# Patient Record
Sex: Female | Born: 1965 | Race: White | Hispanic: No | Marital: Single | Smoking: Current every day smoker
Health system: Southern US, Community
[De-identification: ages and names within clinical notes are randomized; demographics above are authoritative.]

## PROBLEM LIST (undated history)

## (undated) DIAGNOSIS — F209 Schizophrenia, unspecified: Secondary | ICD-10-CM

## (undated) DIAGNOSIS — F411 Generalized anxiety disorder: Secondary | ICD-10-CM

## (undated) DIAGNOSIS — E669 Obesity, unspecified: Secondary | ICD-10-CM

## (undated) DIAGNOSIS — J449 Chronic obstructive pulmonary disease, unspecified: Secondary | ICD-10-CM

## (undated) DIAGNOSIS — Z87820 Personal history of traumatic brain injury: Secondary | ICD-10-CM

## (undated) DIAGNOSIS — I10 Essential (primary) hypertension: Secondary | ICD-10-CM

## (undated) DIAGNOSIS — I5030 Unspecified diastolic (congestive) heart failure: Secondary | ICD-10-CM

## (undated) DIAGNOSIS — F319 Bipolar disorder, unspecified: Secondary | ICD-10-CM

## (undated) DIAGNOSIS — N3281 Overactive bladder: Secondary | ICD-10-CM

## (undated) DIAGNOSIS — D649 Anemia, unspecified: Secondary | ICD-10-CM

## (undated) DIAGNOSIS — M1990 Unspecified osteoarthritis, unspecified site: Secondary | ICD-10-CM

## (undated) DIAGNOSIS — N3941 Urge incontinence: Secondary | ICD-10-CM

## (undated) DIAGNOSIS — M259 Joint disorder, unspecified: Secondary | ICD-10-CM

## (undated) DIAGNOSIS — I639 Cerebral infarction, unspecified: Secondary | ICD-10-CM

## (undated) DIAGNOSIS — Z9181 History of falling: Secondary | ICD-10-CM

## (undated) DIAGNOSIS — K5909 Other constipation: Secondary | ICD-10-CM

## (undated) DIAGNOSIS — R413 Other amnesia: Secondary | ICD-10-CM

## (undated) DIAGNOSIS — F79 Unspecified intellectual disabilities: Secondary | ICD-10-CM

## (undated) DIAGNOSIS — R748 Abnormal levels of other serum enzymes: Secondary | ICD-10-CM

## (undated) DIAGNOSIS — F419 Anxiety disorder, unspecified: Secondary | ICD-10-CM

## (undated) DIAGNOSIS — G839 Paralytic syndrome, unspecified: Secondary | ICD-10-CM

## (undated) HISTORY — DX: Unspecified osteoarthritis, unspecified site: M19.90

## (undated) HISTORY — DX: Essential (primary) hypertension: I10

## (undated) HISTORY — DX: Anxiety disorder, unspecified: F41.9

## (undated) HISTORY — DX: Cerebral infarction, unspecified: I63.9

## (undated) HISTORY — DX: History of falling: Z91.81

## (undated) HISTORY — PX: OTHER SURGICAL HISTORY: SHX169

## (undated) HISTORY — PX: BRAIN SURGERY: SHX531

## (undated) HISTORY — DX: Other constipation: K59.09

## (undated) HISTORY — DX: Abnormal levels of other serum enzymes: R74.8

## (undated) HISTORY — DX: Overactive bladder: N32.81

---

## 1985-11-06 DIAGNOSIS — W3400XA Accidental discharge from unspecified firearms or gun, initial encounter: Secondary | ICD-10-CM

## 1985-11-06 HISTORY — DX: Accidental discharge from unspecified firearms or gun, initial encounter: W34.00XA

## 2002-02-18 ENCOUNTER — Encounter: Payer: Self-pay | Admitting: Emergency Medicine

## 2002-02-18 ENCOUNTER — Emergency Department (HOSPITAL_COMMUNITY): Admission: EM | Admit: 2002-02-18 | Discharge: 2002-02-18 | Payer: Self-pay | Admitting: Emergency Medicine

## 2017-08-01 ENCOUNTER — Encounter: Payer: Self-pay | Admitting: Podiatry

## 2017-08-01 ENCOUNTER — Ambulatory Visit (INDEPENDENT_AMBULATORY_CARE_PROVIDER_SITE_OTHER): Payer: Medicare Other | Admitting: Podiatry

## 2017-08-01 VITALS — BP 117/72 | HR 64 | Resp 16

## 2017-08-01 DIAGNOSIS — L600 Ingrowing nail: Secondary | ICD-10-CM | POA: Diagnosis not present

## 2017-08-01 DIAGNOSIS — B351 Tinea unguium: Secondary | ICD-10-CM | POA: Diagnosis not present

## 2017-08-01 NOTE — Progress Notes (Signed)
Subjective:    Patient ID: Karen Craig, female   DOB: 51 y.o.   MRN: [id number]   HPI patient presents stating she's had difficulties with her nails with the right being worse than the left with nail thickness dystrophic changes and sometimes inability to wear shoe gear comfortably   Review of Systems  All other systems reviewed and are negative.       Objective:  Physical Exam  Constitutional: She appears well-developed and well-nourished.  Cardiovascular: Intact distal pulses.   Pulmonary/Chest: Effort normal.  Musculoskeletal: Normal range of motion.  Neurological: She is alert. Sensory deficit: neurovascular status was found to be intact with muscle strength adequate range of motion mildly diminished. Patient's found have severe thickness dystrophic changes with discomfort of the hallux nail right over left.  Skin: Skin is warm.  Nursing note and vitals reviewed. 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

## 2017-08-01 NOTE — Progress Notes (Signed)
   Subjective:    Patient ID: Karen Craig, female    DOB: 01/16/1966, 51 y.o.   MRN: [id number]  HPI Chief Complaint  Patient presents with  . Nail Problem    Bilateral; great toes; nail discoloration & thickened nails; pt stated, "I have had this for a long time; my nails are sticking straight up"      Review of Systems  All other systems reviewed and are negative.      Objective:   Physical Exam        Assessment & Plan:

## 2018-03-04 ENCOUNTER — Telehealth: Payer: Self-pay | Admitting: Gastroenterology

## 2018-03-04 NOTE — Telephone Encounter (Signed)
Blood work February 15, 2018 show AST 128, ALT 177, hepatitis A antibody IgM negative, hepatitis B surface antigen negative, hepatitis B core antibody IgM negative, hepatitis C virus antibody undetectable.  From office note by referring physician seems reasonable that we see her for abnormal liver tests.  Please arrange for office appointment, my next available new GI spot for elevated liver tests.

## 2018-03-04 NOTE — Telephone Encounter (Signed)
Dr. Ardis Hughs is Doc of the Day for 03/04/18 AM. Records placed on his desk for review.  Received referral from [facility] to schedule an appointment for this patient. Patient is being referred for elevated liver function test.  Cipriano Bunker, NP [phone] or [phone]

## 2018-03-04 NOTE — Telephone Encounter (Signed)
Appointment scheduled.

## 2018-03-05 ENCOUNTER — Encounter: Payer: Self-pay | Admitting: Gastroenterology

## 2018-04-23 ENCOUNTER — Other Ambulatory Visit (INDEPENDENT_AMBULATORY_CARE_PROVIDER_SITE_OTHER): Payer: Medicare Other

## 2018-04-23 ENCOUNTER — Ambulatory Visit (INDEPENDENT_AMBULATORY_CARE_PROVIDER_SITE_OTHER): Payer: Medicare Other | Admitting: Gastroenterology

## 2018-04-23 ENCOUNTER — Encounter (INDEPENDENT_AMBULATORY_CARE_PROVIDER_SITE_OTHER): Payer: Self-pay

## 2018-04-23 ENCOUNTER — Encounter: Payer: Self-pay | Admitting: Gastroenterology

## 2018-04-23 VITALS — BP 110/68 | HR 76 | Ht 66.0 in | Wt 168.0 lb

## 2018-04-23 DIAGNOSIS — R945 Abnormal results of liver function studies: Secondary | ICD-10-CM | POA: Diagnosis not present

## 2018-04-23 DIAGNOSIS — R7989 Other specified abnormal findings of blood chemistry: Secondary | ICD-10-CM

## 2018-04-23 LAB — CBC WITH DIFFERENTIAL/PLATELET
BASOS PCT: 1 % (ref 0.0–3.0)
Basophils Absolute: 0 10*3/uL (ref 0.0–0.1)
EOS PCT: 3.9 % (ref 0.0–5.0)
Eosinophils Absolute: 0.2 10*3/uL (ref 0.0–0.7)
HCT: 34.9 % — ABNORMAL LOW (ref 36.0–46.0)
HEMOGLOBIN: 11.6 g/dL — AB (ref 12.0–15.0)
LYMPHS ABS: 1.2 10*3/uL (ref 0.7–4.0)
Lymphocytes Relative: 27.8 % (ref 12.0–46.0)
MCHC: 33.3 g/dL (ref 30.0–36.0)
MCV: 83.9 fl (ref 78.0–100.0)
MONO ABS: 0.3 10*3/uL (ref 0.1–1.0)
Monocytes Relative: 6.9 % (ref 3.0–12.0)
Neutro Abs: 2.7 10*3/uL (ref 1.4–7.7)
Neutrophils Relative %: 60.4 % (ref 43.0–77.0)
Platelets: 284 10*3/uL (ref 150.0–400.0)
RBC: 4.17 Mil/uL (ref 3.87–5.11)
RDW: 14.5 % (ref 11.5–15.5)
WBC: 4.5 10*3/uL (ref 4.0–10.5)

## 2018-04-23 LAB — COMPREHENSIVE METABOLIC PANEL
ALT: 31 U/L (ref 0–35)
AST: 24 U/L (ref 0–37)
Albumin: 4.1 g/dL (ref 3.5–5.2)
Alkaline Phosphatase: 61 U/L (ref 39–117)
BUN: 12 mg/dL (ref 6–23)
CHLORIDE: 107 meq/L (ref 96–112)
CO2: 29 meq/L (ref 19–32)
CREATININE: 0.91 mg/dL (ref 0.40–1.20)
Calcium: 9.8 mg/dL (ref 8.4–10.5)
GFR: 68.83 mL/min (ref >60.00)
Glucose, Bld: 85 mg/dL (ref 70–99)
POTASSIUM: 3.6 meq/L (ref 3.5–5.1)
SODIUM: 144 meq/L (ref 135–145)
Total Bilirubin: 0.4 mg/dL (ref 0.2–1.2)
Total Protein: 7.3 g/dL (ref 6.0–8.3)

## 2018-04-23 LAB — PROTIME-INR
INR: 1.1 ratio — AB (ref 0.8–1.0)
Prothrombin Time: 12.5 s (ref 9.6–13.1)

## 2018-04-23 LAB — IBC PANEL
Iron: 47 ug/dL (ref 42–145)
Saturation Ratios: 10.9 % — ABNORMAL LOW (ref 20.0–50.0)
Transferrin: 309 mg/dL (ref 212.0–360.0)

## 2018-04-23 LAB — FERRITIN: FERRITIN: 6.3 ng/mL — AB (ref 10.0–291.0)

## 2018-04-23 NOTE — Progress Notes (Signed)
HPI: This is a very pleasant 51 year old woman who was referred to me Cipriano Bunker elevated liver tests Chief complaint is elevated liver tests  She is a resident of [facility] assisted living in [place].  She is here today in a wheelchair.  A assistant from [facility] is with her.  She seems pretty scattered with her history.  She does not know the year and she did not know what city she is in right now.  I do not think that she can reliably give consent for procedures, care.  When we agreed to this visit I assume that someone would be coming with her who could give consent but that is not the case it appears.  She tells me she may have had liver issues when she was 17 or 19 or 37.  She is not really sure.  She does have mild intermittent abdominal pains.  She also reports a bloody stool last month on one occasion.  I think she has been gaining weight lately.  Old Data Reviewed: Blood work February 15, 2018 show AST 128, ALT 177, hepatitis A antibody IgM negative, hepatitis B surface antigen negative, hepatitis B core antibody IgM negative, hepatitis C virus antibody undetectable.     Review of systems: Pertinent positive and negative review of systems were noted in the above HPI section. All other review negative.   Past Medical History:  Diagnosis Date  . Anxiety   . At risk for falling   . Chronic constipation   . 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 Elevated liver enzymes   . HTN (hypertension)   . OA (osteoarthritis)   . OAB (overactive bladder)     History reviewed. No pertinent surgical history.  Current Outpatient Medications  Medication Sig Dispense Refill  . furosemide (LASIX) 20 MG tablet Take 20 mg by mouth daily.    Marland Kitchen gabapentin (NEURONTIN) 100 MG capsule Take 100 mg by mouth 3 (three) times daily.    Marland Kitchen LORazepam (ATIVAN) 0.5 MG tablet Take 0.5 mg by mouth daily.    . medroxyPROGESTERone (DEPO-PROVERA) 150 MG/ML injection Inject  150 mg into the muscle every 7 (seven) days.    . Multiple Vitamins-Minerals (CERTAVITE SENIOR/ANTIOXIDANT PO) Take by mouth.    . omega-3 acid ethyl esters (LOVAZA) 1 g capsule Take by mouth 2 (two) times daily.    . paroxetine mesylate (PEXEVA) 30 MG tablet Take 30 mg by mouth every morning.    . polyethylene glycol (MIRALAX / GLYCOLAX) packet Take 17 g by mouth daily.    . risperiDONE (RISPERDAL) 2 MG tablet Take 2 mg by mouth 2 (two) times daily.    . solifenacin (VESICARE) 5 MG tablet Take 5 mg by mouth daily.     No current facility-administered medications for this visit.     Allergies as of 04/23/2018  . (No Known Allergies)    History reviewed. No pertinent family history.  Social History   Socioeconomic History  . Marital status: Single    Spouse name: Not on file  . Number of children: Not on file  . Years of education: Not on file  . Highest education level: Not on file  Occupational History  . Occupation: Disabled  Social Needs  . Financial resource strain: Not on file  . Food insecurity:    Worry: Not on file    Inability: Not on file  . Transportation needs:    Medical: Not on  file    Non-medical: Not on file  Tobacco Use  . Smoking status: Current Every Day Smoker  . Smokeless tobacco: Never Used  Substance and Sexual Activity  . Alcohol use: Not on file  . Drug use: Not on file  . Sexual activity: Not on file  Lifestyle  . Physical activity:    Days per week: Not on file    Minutes per session: Not on file  . Stress: Not on file  Relationships  . Social connections:    Talks on phone: Not on file    Gets together: Not on file    Attends religious service: Not on file    Active member of club or organization: Not on file    Attends meetings of clubs or organizations: Not on file    Relationship status: Not on file  . Intimate partner violence:    Fear of current or ex partner: Not on file    Emotionally abused: Not on file    Physically  abused: Not on file    Forced sexual activity: Not on file  Other Topics Concern  . Not on file  Social History Narrative  . Not on file     Physical Exam: BP 110/68   Pulse 76   Ht 5\' 6"  (1.676 m)   Wt 168 lb (76.2 kg)   BMI 27.12 kg/m  Constitutional: Sitting in a wheelchair strapped to it Psychiatric: Alert and oriented x1, she is not sure of the year or where she is Eyes: extraocular movements intact Mouth: oral pharynx moist, no lesions Neck: supple no lymphadenopathy Cardiovascular: heart regular rate and rhythm Lungs: clear to auscultation bilaterally Abdomen: soft, nontender, nondistended, no obvious ascites, no peritoneal signs, normal bowel sounds Extremities: no lower extremity edema bilaterally Skin: no lesions on visible extremities   Assessment and plan: 52 y.o. female with elevated liver tests, bloody stool x1  First, I do not think that she can reliably consent to medical care.  When we agreed to this visit I assume that someone will be coming with her from [facility] assisted living but that does not seem to be the case except for a assistant.  No one is here that can arrange for consent.  I think it is safe to proceed with some basic work-up for her elevated liver tests including lab testing and ultrasound.  See that summarized below in patient instructions.  She has had a bloody stool recently and she is 24.  Generally recommend a colonoscopy in this situation but I do not think she can reliably agree to consent.  We will try to track down someone from her family, care team at her [facility] assisted living in [place] to see about that.    Please see the "Patient Instructions" section for addition details about the plan.   Owens Loffler, MD [facility] 04/23/2018, 9:41 AM  Cc: No ref. provider found

## 2018-04-23 NOTE — Patient Instructions (Addendum)
You will get labs drawn today:  Hepatitis A (IgM and IgG), Hepatitis B surface antibody, total iron, ferritin, TIBC, ANA, AMA, anti smooth muscle antibody, alpha 1 antitrypsin, cerulloplasm, TTG, total IgA level, CBC, CMET, INR. You will be set up for an ultrasound for elevated liver tests.   Need to find out who is power of attorney to help sign consent for procedures.  You have been scheduled for an abdominal ultrasound at [facility] (1st floor of hospital) on 05/01/18 at [place]. Please arrive 15 minutes prior to your appointment for registration. Make certain not to have anything to eat or drink 6 hours prior to your appointment. Should you need to reschedule your appointment, please contact radiology at [phone]. This test typically takes about 30 minutes to perform.  Normal BMI (Body Mass Index- based on height and weight) is between 19 and 25. Your BMI today is Body mass index is 27.12 kg/m. Marland Kitchen Please consider follow up  regarding your BMI with your Primary Care Provider.

## 2018-04-25 LAB — ALPHA-1-ANTITRYPSIN: A1 ANTITRYPSIN SER: 166 mg/dL (ref 83–199)

## 2018-04-25 LAB — HEPATITIS A ANTIBODY, IGM: Hep A IgM: NONREACTIVE

## 2018-04-25 LAB — CERULOPLASMIN: CERULOPLASMIN: 32 mg/dL (ref 18–53)

## 2018-04-25 LAB — MITOCHONDRIAL ANTIBODIES: Mitochondrial M2 Ab, IgG: 24.3 U — ABNORMAL HIGH

## 2018-04-25 LAB — HEPATITIS A ANTIBODY, TOTAL: Hepatitis A AB,Total: NONREACTIVE

## 2018-04-25 LAB — ANTI-NUCLEAR AB-TITER (ANA TITER)

## 2018-04-25 LAB — ANA: Anti Nuclear Antibody(ANA): POSITIVE — AB

## 2018-04-25 LAB — TISSUE TRANSGLUTAMINASE, IGA: (TTG) AB, IGA: 1 U/mL

## 2018-04-25 LAB — ANTI-SMOOTH MUSCLE ANTIBODY, IGG: Actin (Smooth Muscle) Antibody (IGG): <20 U (ref <20)

## 2018-04-25 LAB — HEPATITIS B SURFACE ANTIBODY,QUALITATIVE: Hep B S Ab: NONREACTIVE

## 2018-05-01 ENCOUNTER — Ambulatory Visit (HOSPITAL_COMMUNITY): Admission: RE | Admit: 2018-05-01 | Payer: Medicare Other | Source: Ambulatory Visit

## 2018-05-07 ENCOUNTER — Telehealth: Payer: Self-pay | Admitting: Gastroenterology

## 2018-05-07 ENCOUNTER — Telehealth: Payer: Self-pay

## 2018-05-07 NOTE — Telephone Encounter (Signed)
Kim from [facility] just called. She stated that somebody from our office left a message this morning saying that we have not been able to contact pt's guardian. Maudie Mercury stated that she gave pt's guardian our phone number to call us last week. Any question pls call Kim at [phone].

## 2018-05-07 NOTE — Telephone Encounter (Signed)
I spoke with Winfield Rast POA for patient to inform her that her colonoscopy pre visit will be on 07/02/18 at 11am and she will be there to sign the consent for patient. Her Colonoscopy is scheduled for 07/17/18 at 2pm. I notified [facility] and spoke with the nursing supervisor Maudie Mercury. The Director of the facility will be out of the office until next week. Maudie Mercury was given dates and times for the upcoming appointments and reassured me that all instructions will be followed in order keep appointment. She stated there will be an aide assisting patient with her diet and the colon prep. I advised Maudie Mercury to call me if she has any questions prior to patients appointment. She voiced understanding.

## 2018-05-22 ENCOUNTER — Ambulatory Visit (HOSPITAL_COMMUNITY)
Admission: RE | Admit: 2018-05-22 | Discharge: 2018-05-22 | Disposition: A | Discharge status: Home / Self Care | Payer: Medicare Other | Source: Ambulatory Visit | Attending: Gastroenterology | Admitting: Gastroenterology

## 2018-05-22 DIAGNOSIS — R7989 Other specified abnormal findings of blood chemistry: Secondary | ICD-10-CM | POA: Diagnosis not present

## 2018-05-22 DIAGNOSIS — R945 Abnormal results of liver function studies: Secondary | ICD-10-CM

## 2018-05-22 DIAGNOSIS — J90 Pleural effusion, not elsewhere classified: Secondary | ICD-10-CM | POA: Diagnosis not present

## 2018-06-03 ENCOUNTER — Other Ambulatory Visit: Payer: Self-pay

## 2018-06-03 DIAGNOSIS — R7989 Other specified abnormal findings of blood chemistry: Secondary | ICD-10-CM

## 2018-06-03 DIAGNOSIS — R945 Abnormal results of liver function studies: Principal | ICD-10-CM

## 2018-06-27 ENCOUNTER — Other Ambulatory Visit: Payer: Self-pay

## 2018-06-27 ENCOUNTER — Emergency Department (HOSPITAL_COMMUNITY): Payer: Medicare Other

## 2018-06-27 ENCOUNTER — Other Ambulatory Visit: Payer: Self-pay | Admitting: Gastroenterology

## 2018-06-27 ENCOUNTER — Emergency Department (HOSPITAL_COMMUNITY)
Admission: EM | Admit: 2018-06-27 | Discharge: 2018-06-27 | Disposition: A | Discharge status: Skilled Nursing Facility | Payer: Medicare Other | Attending: Emergency Medicine | Admitting: Emergency Medicine

## 2018-06-27 ENCOUNTER — Encounter (HOSPITAL_COMMUNITY): Payer: Self-pay | Admitting: Emergency Medicine

## 2018-06-27 DIAGNOSIS — I110 Hypertensive heart disease with heart failure: Secondary | ICD-10-CM | POA: Insufficient documentation

## 2018-06-27 DIAGNOSIS — R531 Weakness: Secondary | ICD-10-CM | POA: Diagnosis not present

## 2018-06-27 DIAGNOSIS — I5030 Unspecified diastolic (congestive) heart failure: Secondary | ICD-10-CM | POA: Diagnosis not present

## 2018-06-27 DIAGNOSIS — R29818 Other symptoms and signs involving the nervous system: Secondary | ICD-10-CM | POA: Diagnosis not present

## 2018-06-27 DIAGNOSIS — Z79899 Other long term (current) drug therapy: Secondary | ICD-10-CM | POA: Diagnosis not present

## 2018-06-27 DIAGNOSIS — F17200 Nicotine dependence, unspecified, uncomplicated: Secondary | ICD-10-CM | POA: Diagnosis not present

## 2018-06-27 DIAGNOSIS — J069 Acute upper respiratory infection, unspecified: Secondary | ICD-10-CM | POA: Diagnosis not present

## 2018-06-27 DIAGNOSIS — R05 Cough: Secondary | ICD-10-CM | POA: Diagnosis present

## 2018-06-27 HISTORY — DX: Other amnesia: R41.3

## 2018-06-27 HISTORY — DX: Joint disorder, unspecified: M25.9

## 2018-06-27 HISTORY — DX: Unspecified diastolic (congestive) heart failure: I50.30

## 2018-06-27 HISTORY — DX: Urge incontinence: N39.41

## 2018-06-27 HISTORY — DX: Paralytic syndrome, unspecified: G83.9

## 2018-06-27 HISTORY — DX: Generalized anxiety disorder: F41.1

## 2018-06-27 HISTORY — DX: Obesity, unspecified: E66.9

## 2018-06-27 HISTORY — DX: Bipolar disorder, unspecified: F31.9

## 2018-06-27 LAB — CBC WITH DIFFERENTIAL/PLATELET
Basophils Absolute: 0 10*3/uL (ref 0.0–0.1)
Basophils Relative: 1 %
EOS ABS: 0.3 10*3/uL (ref 0.0–0.7)
EOS PCT: 4 %
HCT: 33.2 % — ABNORMAL LOW (ref 36.0–46.0)
Hemoglobin: 10.4 g/dL — ABNORMAL LOW (ref 12.0–15.0)
LYMPHS ABS: 2 10*3/uL (ref 0.7–4.0)
LYMPHS PCT: 36 %
MCH: 26.9 pg (ref 26.0–34.0)
MCHC: 31.3 g/dL (ref 30.0–36.0)
MCV: 85.8 fL (ref 78.0–100.0)
MONO ABS: 0.6 10*3/uL (ref 0.1–1.0)
Monocytes Relative: 10 %
Neutro Abs: 2.8 10*3/uL (ref 1.7–7.7)
Neutrophils Relative %: 49 %
PLATELETS: 266 10*3/uL (ref 150–400)
RBC: 3.87 MIL/uL (ref 3.87–5.11)
RDW: 15.5 % (ref 11.5–15.5)
WBC: 5.6 10*3/uL (ref 4.0–10.5)

## 2018-06-27 LAB — COMPREHENSIVE METABOLIC PANEL
ALT: 15 U/L (ref 0–44)
ANION GAP: 6 (ref 5–15)
AST: 14 U/L — ABNORMAL LOW (ref 15–41)
Albumin: 3.5 g/dL (ref 3.5–5.0)
Alkaline Phosphatase: 44 U/L (ref 38–126)
BUN: 12 mg/dL (ref 6–20)
CHLORIDE: 110 mmol/L (ref 98–111)
CO2: 26 mmol/L (ref 22–32)
CREATININE: 0.89 mg/dL (ref 0.44–1.00)
Calcium: 9 mg/dL (ref 8.9–10.3)
Glucose, Bld: 91 mg/dL (ref 70–99)
POTASSIUM: 3.4 mmol/L — AB (ref 3.5–5.1)
SODIUM: 142 mmol/L (ref 135–145)
Total Bilirubin: 0.3 mg/dL (ref 0.3–1.2)
Total Protein: 6.6 g/dL (ref 6.5–8.1)

## 2018-06-27 LAB — TROPONIN I

## 2018-06-27 NOTE — ED Provider Notes (Signed)
[facility] EMERGENCY DEPARTMENT Provider Note   CSN: [id number] Arrival date & time: 06/27/18  1432     History   Chief Complaint Chief Complaint  Patient presents with  . Emesis    HPI Karen Craig is a 52 y.o. female.  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  Patient presents with a "cough" for 1 week.  Allegedly, she also vomited x1 today.  No substernal chest pain, dyspnea, fever, sweats, chills, dysuria.     Past Medical History:  Diagnosis Date  . Anxiety   . At risk for falling   . Bipolar 1 disorder ([place])   . Chronic constipation   . 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 Diastolic heart failure ([place])   . Elevated liver enzymes   . Generalized anxiety disorder   . HTN (hypertension)   . Joint disorder   . Memory loss   . OA (osteoarthritis)   . OAB (overactive bladder)   . Obesity   . 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 Urge incontinence     There are no active problems to display for this patient.   No past surgical history on file.   OB History   None      Home Medications    Prior to Admission medications   Medication Sig Start Date End Date Taking? Authorizing Provider  furosemide (LASIX) 20 MG tablet Take 20 mg by mouth daily.   Yes [provider]  gabapentin (NEURONTIN) 100 MG capsule Take 100 mg by mouth 3 (three) times daily.   Yes [provider]  LORazepam (ATIVAN) 0.5 MG tablet Take 0.5 mg by mouth 2 (two) times daily.    Yes [provider]  medroxyPROGESTERone (DEPO-PROVERA) 150 MG/ML injection Inject 150 mg into the muscle every 7 (seven) days.   Yes [provider]  Multiple Vitamins-Minerals (CERTAVITE SENIOR/ANTIOXIDANT PO) Take 1 tablet by mouth daily.    Yes [provider]  omega-3 acid ethyl esters (LOVAZA) 1 g capsule Take by mouth 2 (two) times daily.   Yes [provider]  PARoxetine (PAXIL) 30  MG tablet Take 30 mg by mouth daily.   Yes [provider]  polyethylene glycol (MIRALAX / GLYCOLAX) packet Take 17 g by mouth daily.   Yes [provider]  risperiDONE (RISPERDAL) 2 MG tablet Take 2 mg by mouth 2 (two) times daily.   Yes [provider]  solifenacin (VESICARE) 5 MG tablet Take 5 mg by mouth daily.   Yes [provider]    Family History No family history on file.  Social History Social History   Tobacco Use  . Smoking status: Current Every Day Smoker  . Smokeless tobacco: Never Used  Substance Use Topics  . Alcohol use: Not on file  . Drug use: Not on file     Allergies   Patient has no known allergies.   Review of Systems Review of Systems  Unable to perform ROS: Other (Memory loss)     Physical Exam Updated Vital Signs BP 128/68   Pulse (!) 58   Temp 98.4 F (36.9 C) (Oral)   Resp 16   Ht 5\' 6"  (1.676 m)   Wt 76.2 kg   SpO2 96%   BMI 27.12 kg/m   Physical Exam  Constitutional:  Difficult to understand speech.  HENT:  Head: Normocephalic and atraumatic.  Eyes: Conjunctivae are normal.  Neck: Neck  supple.  Cardiovascular: Normal rate and regular rhythm.  Pulmonary/Chest: Effort normal and breath sounds normal.  Abdominal: Soft. Bowel sounds are normal.  Musculoskeletal: Normal range of motion.  Neurological: She is alert.  Right arm weakness (old)  Skin: Skin is warm and dry.  Psychiatric: She has a normal mood and affect. Her behavior is normal.  Nursing note and vitals reviewed.    ED Treatments / Results  Labs (all labs ordered are listed, but only abnormal results are displayed) Labs Reviewed  CBC WITH DIFFERENTIAL/PLATELET - Abnormal; Notable for the following components:      Result Value   Hemoglobin 10.4 (*)    HCT 33.2 (*)    All other components within normal limits  COMPREHENSIVE METABOLIC PANEL - Abnormal; Notable for the following components:   Potassium 3.4 (*)    AST 14 (*)      All other components within normal limits  TROPONIN I    EKG EKG Interpretation  Date/Time:  Thursday June 27 2018 15:29:42 EDT Ventricular Rate:  60 PR Interval:    QRS Duration: 98 QT Interval:  414 QTC Calculation: 414 R Axis:   108 Text Interpretation:  Sinus rhythm Right axis deviation Confirmed by Nat Christen [id number]) on 06/27/2018 5:04:57 PM   Radiology Dg Chest 2 View  Result Date: 06/27/2018 CLINICAL DATA:  Cough and chest pain EXAM: CHEST - 2 VIEW COMPARISON:  None. FINDINGS: Borderline heart size distorted by positioning. Negative mediastinal contours. There is no edema, consolidation, effusion, or pneumothorax. Thoracolumbar levocurvature. IMPRESSION: No evidence of active disease. Electronically Signed   By: Monte Fantasia M.D.   On: 06/27/2018 15:57   Ct Head Wo Contrast  Result Date: 06/27/2018 CLINICAL DATA:  52 y/o F; vomiting today. History of paralytic syndrome and hypertension. Focal neuro deficit, > 6 hrs, stroke suspected right arm weakness. EXAM: CT HEAD WITHOUT CONTRAST TECHNIQUE: Contiguous axial images were obtained from the base of the skull through the vertex without intravenous contrast. COMPARISON:  None. FINDINGS: Brain: Large area of encephalomalacia of the right posterior cerebral hemisphere and small region of encephalomalacia of the left medial parietal lobe. Ex vacuo dilatation of the right lateral ventricle. No acute stroke, hemorrhage, focal mass effect, extra-axial collection, or herniation identified. Vascular: No hyperdense vessel or unexpected calcification. Skull: Large right posterior convexity cranioplasty chronic postsurgical changes. Sinuses/Orbits: No acute finding. Other: None. IMPRESSION: 1. No acute intracranial abnormality identified. 2. Large area of encephalomalacia of the right posterior cerebral hemisphere and small region of encephalomalacia of the left medial parietal lobe. Electronically Signed   By: Kristine Garbe M.D.    On: 06/27/2018 16:27    Procedures Procedures (including critical care time)  Medications Ordered in ED Medications - No data to display   Initial Impression / Assessment and Plan / ED Course  I have reviewed the triage vital signs and the nursing notes.  Pertinent labs & imaging results that were available during my care of the patient were reviewed by me and considered in my medical decision making (see chart for details).     Patient appears in no acute distress.  Screening labs, chest x-ray, CT head showed no acute findings.  I do not think this patient meets criteria for admission.  Final Clinical Impressions(s) / ED Diagnoses   Final diagnoses:  Upper respiratory tract infection, unspecified type    ED Discharge Orders    None       Nat Christen, MD 06/27/18 [phone]

## 2018-06-27 NOTE — ED Triage Notes (Signed)
[facility] from [facility]. Per ems and facility staff pt began vomiting this am. Pt has been off abx for "cough" x1 week. Pt alert and reports sore throat and chest discomfort since emesis onset. nad noted. Airway patent.

## 2018-06-27 NOTE — Discharge Instructions (Addendum)
Tests showed no life-threatening condition.  Specifically, chest x-ray and blood work were acceptable.

## 2018-06-27 NOTE — ED Notes (Signed)
[facility] contacted for transport. Christy at [facility] updated via phone concerning pending discharge and transport to take place in two hours.

## 2018-06-28 LAB — HEPATIC FUNCTION PANEL
ALBUMIN: 4.1 g/dL (ref 3.5–5.5)
ALK PHOS: 52 IU/L (ref 39–117)
ALT: 12 IU/L (ref 0–32)
AST: 13 IU/L (ref 0–40)
BILIRUBIN TOTAL: 0.2 mg/dL (ref 0.0–1.2)
BILIRUBIN, DIRECT: 0.07 mg/dL (ref 0.00–0.40)
Total Protein: 6.7 g/dL (ref 6.0–8.5)

## 2018-06-28 LAB — SPECIMEN STATUS REPORT

## 2018-07-02 ENCOUNTER — Other Ambulatory Visit: Payer: Self-pay

## 2018-07-02 ENCOUNTER — Other Ambulatory Visit: Payer: Medicare Other

## 2018-07-02 ENCOUNTER — Telehealth: Payer: Self-pay | Admitting: *Deleted

## 2018-07-02 ENCOUNTER — Ambulatory Visit (AMBULATORY_SURGERY_CENTER): Payer: Self-pay | Admitting: *Deleted

## 2018-07-02 VITALS — Wt 165.0 lb

## 2018-07-02 DIAGNOSIS — K625 Hemorrhage of anus and rectum: Secondary | ICD-10-CM

## 2018-07-02 DIAGNOSIS — R945 Abnormal results of liver function studies: Principal | ICD-10-CM

## 2018-07-02 DIAGNOSIS — R7989 Other specified abnormal findings of blood chemistry: Secondary | ICD-10-CM

## 2018-07-02 MED ORDER — PEG 3350-KCL-NA BICARB-NACL 420 G PO SOLR: 4000.0000 mL | Freq: Once | ORAL | 0 refills | Status: AC

## 2018-07-02 NOTE — Telephone Encounter (Signed)
I agree she should be done in hospital setting.  Chong Sicilian, can you look at Thursday sept 19th or Thursday sept 26th for her for colonoscopy.  thanks

## 2018-07-02 NOTE — Telephone Encounter (Signed)
The pt has been scheduled for 9/12 at 1015 am and instructed today in previsit.

## 2018-07-02 NOTE — Telephone Encounter (Signed)
Patient in [place]. Patient is accompanied by her assistant fro [facility]. Patient's POA is not present and unable to contact. Functionally the patient is non ambulatory. Unable to transfer her self without mod to max. Assistance. Patient is unable to stand to be weighed. Due to functional status patient is not appropriate for [facility]. Would you like to reschedule for the hospital setting.

## 2018-07-02 NOTE — Progress Notes (Signed)
No egg or soy allergy known to patient  No issues with past sedation with any surgeries  or procedures, no intubation problems  No diet pills per patient No home 02 use per patient  No blood thinners per patient  Pt denies issues with constipation  No A fib or A flutter  Patient is not appropriate for [facility] related to mobility status. Discussed with Dr Ardis Hughs, procedure rescheduled.POA present during visit. POA knows to be present at procedure dfor consent.

## 2018-07-06 LAB — ANA SCREEN,IFA,REFLEX TITER/PATTERN,REFLEX MPLX 11 AB CASCADE
14-3-3 eta Protein: <0.2 ng/mL (ref <0.2)
ANA: POSITIVE — AB

## 2018-07-06 LAB — ANTI-NUCLEAR AB-TITER (ANA TITER)

## 2018-07-06 LAB — TIER 1
CHROMATIN (NUCLEOSOMAL) ANTIBODY: POSITIVE AI — AB
ENA SM Ab Ser-aCnc: <1 AI
RIBONUCLEIC PROTEIN(ENA) ANTIBODY, IGG: POSITIVE AI — AB
SM/RNP: >8 AI — AB
ds DNA Ab: <1 IU/mL

## 2018-07-06 LAB — INTERPRETATION

## 2018-07-09 ENCOUNTER — Other Ambulatory Visit: Payer: Self-pay

## 2018-07-09 DIAGNOSIS — R768 Other specified abnormal immunological findings in serum: Secondary | ICD-10-CM

## 2018-07-10 ENCOUNTER — Telehealth: Payer: Self-pay

## 2018-07-10 NOTE — Telephone Encounter (Signed)
Carole Binning, LPN  Timothy Lasso, RN        We appreciate your referral. Each referral is reviewed by our providers to ensure that the patient will receive the best medical care possible. Upon review of this referral Dr. Estanislado Pandy has declined it.

## 2018-07-10 NOTE — Telephone Encounter (Signed)
I spoke with [facility] and an app tis being made with Dr Amil Amen they will contact the pt and set up an appt that works with the assisted living facility

## 2018-07-15 ENCOUNTER — Telehealth: Payer: Self-pay

## 2018-07-15 NOTE — Telephone Encounter (Signed)
I spoke to Maudie Mercury this morning from the assisted living home that patient lives in. She states patient has her prep for upcoming colonoscopy 07/18/18. They will be assisting her to make sure directions are followed correctly.

## 2018-07-15 NOTE — Telephone Encounter (Signed)
-----   Message from Angie Fava, LPN sent at 03/06/8840  3:59 PM EDT ----- Call assisted living [phone] 07/15/18 make sure prep is being done

## 2018-07-17 ENCOUNTER — Encounter: Payer: Medicare Other | Admitting: Gastroenterology

## 2018-07-17 NOTE — Progress Notes (Signed)
Preop instructions for:  Karen Craig            Date of Birth  [birth year]                         Date of Procedure:  07/18/18     Doctor: Ardis Hughs Time to arrive at [facility]: Report to: Admitting  Procedure: Colonoscopy Any procedure time changes, MD office will notify you!   Do not eat or drink past midnight the night before your procedure.(To include any tube feedings-must be discontinued) Reminder:Follow bowel prep instructions per MD office!   Take these morning medications only with sips of water.(or give through gastrostomy or feeding tube).  Note: No Insulin or Diabetic meds should be given or taken the morning of the procedure!   Facility contact: [facility]     Phone: [place] POA: Jennette Banker from [facility]  Transportation contact phone#:[facility] [phone]  Please send day of procedure:current med list and meds last taken that day, confirm nothing by mouth status from what time, Patient Demographic info( to include DNR status, problem list, allergies)   RN contact name/phone#:  Maudie Mercury [phone]  and Fax #: [phone]  Bring Insurance card and picture ID Leave all jewelry and other valuables at place where living( no metal or rings to be worn) No contact lens Women-no make-up, no lotions,perfumes,powders Men-no colognes,lotions  Any questions day of procedure,call Endoscopy unit-[phone]!   Sent from : [facility]                   Phone:[phone]                   [phone]  Sent by :RN

## 2018-07-18 ENCOUNTER — Ambulatory Visit (HOSPITAL_COMMUNITY): Payer: Medicare Other | Admitting: Certified Registered Nurse Anesthetist

## 2018-07-18 ENCOUNTER — Encounter (HOSPITAL_COMMUNITY): Payer: Self-pay | Admitting: *Deleted

## 2018-07-18 ENCOUNTER — Ambulatory Visit (HOSPITAL_COMMUNITY)
Admission: RE | Admit: 2018-07-18 | Discharge: 2018-07-18 | Disposition: A | Discharge status: Home / Self Care | Payer: Medicare Other | Source: Ambulatory Visit | Attending: Gastroenterology | Admitting: Gastroenterology

## 2018-07-18 ENCOUNTER — Other Ambulatory Visit: Payer: Self-pay

## 2018-07-18 ENCOUNTER — Encounter (HOSPITAL_COMMUNITY)
Admission: RE | Disposition: A | Discharge status: Home / Self Care | Payer: Self-pay | Source: Ambulatory Visit | Attending: Gastroenterology

## 2018-07-18 DIAGNOSIS — N3941 Urge incontinence: Secondary | ICD-10-CM | POA: Insufficient documentation

## 2018-07-18 DIAGNOSIS — Z8673 Personal history of transient ischemic attack (TIA), and cerebral infarction without residual deficits: Secondary | ICD-10-CM | POA: Insufficient documentation

## 2018-07-18 DIAGNOSIS — K5909 Other constipation: Secondary | ICD-10-CM | POA: Diagnosis not present

## 2018-07-18 DIAGNOSIS — I110 Hypertensive heart disease with heart failure: Secondary | ICD-10-CM | POA: Insufficient documentation

## 2018-07-18 DIAGNOSIS — M1990 Unspecified osteoarthritis, unspecified site: Secondary | ICD-10-CM | POA: Insufficient documentation

## 2018-07-18 DIAGNOSIS — Z6826 Body mass index (BMI) 26.0-26.9, adult: Secondary | ICD-10-CM | POA: Insufficient documentation

## 2018-07-18 DIAGNOSIS — F419 Anxiety disorder, unspecified: Secondary | ICD-10-CM | POA: Insufficient documentation

## 2018-07-18 DIAGNOSIS — G839 Paralytic syndrome, unspecified: Secondary | ICD-10-CM | POA: Diagnosis not present

## 2018-07-18 DIAGNOSIS — R413 Other amnesia: Secondary | ICD-10-CM | POA: Diagnosis not present

## 2018-07-18 DIAGNOSIS — F17200 Nicotine dependence, unspecified, uncomplicated: Secondary | ICD-10-CM | POA: Diagnosis not present

## 2018-07-18 DIAGNOSIS — E669 Obesity, unspecified: Secondary | ICD-10-CM | POA: Diagnosis not present

## 2018-07-18 DIAGNOSIS — I5032 Chronic diastolic (congestive) heart failure: Secondary | ICD-10-CM | POA: Diagnosis not present

## 2018-07-18 DIAGNOSIS — K649 Unspecified hemorrhoids: Secondary | ICD-10-CM

## 2018-07-18 DIAGNOSIS — F319 Bipolar disorder, unspecified: Secondary | ICD-10-CM | POA: Insufficient documentation

## 2018-07-18 DIAGNOSIS — K644 Residual hemorrhoidal skin tags: Secondary | ICD-10-CM | POA: Diagnosis not present

## 2018-07-18 DIAGNOSIS — N3281 Overactive bladder: Secondary | ICD-10-CM | POA: Diagnosis not present

## 2018-07-18 DIAGNOSIS — K625 Hemorrhage of anus and rectum: Secondary | ICD-10-CM | POA: Diagnosis not present

## 2018-07-18 HISTORY — PX: COLONOSCOPY WITH PROPOFOL: SHX5780

## 2018-07-18 SURGERY — COLONOSCOPY WITH PROPOFOL
Anesthesia: Monitor Anesthesia Care

## 2018-07-18 MED ORDER — ONDANSETRON HCL 4 MG/2ML IJ SOLN
INTRAMUSCULAR | Status: DC | PRN
  Administered 2018-07-18: 4 mg via INTRAVENOUS

## 2018-07-18 MED ORDER — PROPOFOL 10 MG/ML IV BOLUS
INTRAVENOUS | Status: DC | PRN
  Administered 2018-07-18 (×2): 10 mg via INTRAVENOUS
  Administered 2018-07-18: 20 mg via INTRAVENOUS
  Administered 2018-07-18 (×2): 10 mg via INTRAVENOUS
  Administered 2018-07-18: 20 mg via INTRAVENOUS

## 2018-07-18 MED ORDER — SODIUM CHLORIDE 0.9 % IV SOLN: INTRAVENOUS | Status: DC

## 2018-07-18 MED ORDER — PROPOFOL 500 MG/50ML IV EMUL
INTRAVENOUS | Status: DC | PRN
  Administered 2018-07-18: 75 ug/kg/min via INTRAVENOUS

## 2018-07-18 MED ORDER — PROPOFOL 10 MG/ML IV BOLUS
INTRAVENOUS | Status: AC
  Filled 2018-07-18: qty 60

## 2018-07-18 MED ORDER — LIDOCAINE 2% (20 MG/ML) 5 ML SYRINGE
INTRAMUSCULAR | Status: DC | PRN
  Administered 2018-07-18: 80 mg via INTRAVENOUS

## 2018-07-18 MED ORDER — LACTATED RINGERS IV SOLN
INTRAVENOUS | Status: DC
  Administered 2018-07-18: 10:00:00 via INTRAVENOUS

## 2018-07-18 SURGICAL SUPPLY — 22 items

## 2018-07-18 NOTE — H&P (Signed)
HPI: This is a 52 yo woman with recent reported rectal bleeding  Chief complaint is rectal bleeding  ROS: complete GI ROS as described in HPI, all other review negative.  Constitutional:  No unintentional weight loss   Past Medical History:  Diagnosis Date  . Anxiety   . At risk for falling   . Bipolar 1 disorder ([place])   . Chronic constipation   . 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 Diastolic heart failure ([place])   . Elevated liver enzymes   . Generalized anxiety disorder   . HTN (hypertension)   . Joint disorder   . Memory loss   . OA (osteoarthritis)   . OAB (overactive bladder)   . Obesity   . 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 Urge incontinence     Past Surgical History:  Procedure Laterality Date  . arm surgery    . BRAIN SURGERY      Current Facility-Administered Medications  Medication Dose Route Frequency Provider Last Rate Last Dose  . lactated ringers infusion   Intravenous Continuous Milus Banister, MD        Allergies as of 07/02/2018  . (No Known Allergies)    History reviewed. No pertinent family history.  Social History   Socioeconomic History  . Marital status: Single    Spouse name: Not on file  . Number of children: Not on file  . Years of education: Not on file  . Highest education level: Not on file  Occupational History  . Occupation: Disabled  Social Needs  . Financial resource strain: Not on file  . Food insecurity:    Worry: Not on file    Inability: Not on file  . Transportation needs:    Medical: Not on file    Non-medical: Not on file  Tobacco Use  . Smoking status: Current Every Day Smoker    Packs/day: 0.25  . Smokeless tobacco: Never Used  Substance and Sexual Activity  . Alcohol use: Not Currently  . Drug use: Not Currently  . Sexual activity: Not on file  Lifestyle  . Physical activity:    Days per week: Not on file    Minutes per session: Not on file  . Stress: Not on file   Relationships  . Social connections:    Talks on phone: Not on file    Gets together: Not on file    Attends religious service: Not on file    Active member of club or organization: Not on file    Attends meetings of clubs or organizations: Not on file    Relationship status: Not on file  . Intimate partner violence:    Fear of current or ex partner: Not on file    Emotionally abused: Not on file    Physically abused: Not on file    Forced sexual activity: Not on file  Other Topics Concern  . Not on file  Social History Narrative  . Not on file     Physical Exam: BP (!) 117/93   Pulse (!) 55   Temp 98.1 F (36.7 C) (Oral)   Resp 12   Ht 5\' 6"  (1.676 m)   Wt 74.8 kg   SpO2 98%   BMI 26.63 kg/m  Constitutional: generally well-appearing Psychiatric: alert and oriented x3 Abdomen: soft, nontender, nondistended, no obvious ascites, no peritoneal signs, normal bowel sounds No peripheral edema noted in lower extremities  Assessment and plan: 52 y.o. female with  rectal bleeding  For colonoscopy today.  Please see the "Patient Instructions" section for addition details about the plan.  Owens Loffler, MD [facility] 07/18/2018, 9:23 AM

## 2018-07-18 NOTE — Anesthesia Preprocedure Evaluation (Signed)
Anesthesia Evaluation  Patient identified by MRN, date of birth, ID band Patient awake    Reviewed: Allergy & Precautions, NPO status , Patient's Chart, lab work & pertinent test results  Airway Mallampati: III  TM Distance: >3 FB Neck ROM: Full    Dental  (+) Teeth Intact   Pulmonary Recent URI , Residual Cough, Current Smoker,    breath sounds clear to auscultation       Cardiovascular hypertension,  Rhythm:Regular     Neuro/Psych PSYCHIATRIC DISORDERS Anxiety Bipolar Disorder Left hemiplegia   Neuromuscular disease CVA, Residual Symptoms    GI/Hepatic Neg liver ROS, rectal bleeding   Endo/Other  negative endocrine ROS  Renal/GU negative Renal ROS     Musculoskeletal  (+) Arthritis ,   Abdominal   Peds  Hematology negative hematology ROS (+)   Anesthesia Other Findings   Reproductive/Obstetrics                             Anesthesia Physical Anesthesia Plan  ASA: III  Anesthesia Plan: MAC   Post-op Pain Management:    Induction:   PONV Risk Score and Plan: 1 and Treatment may vary due to age or medical condition  Airway Management Planned: Nasal Cannula  Additional Equipment: None  Intra-op Plan:   Post-operative Plan:   Informed Consent: I have reviewed the patients History and Physical, chart, labs and discussed the procedure including the risks, benefits and alternatives for the proposed anesthesia with the patient or authorized representative who has indicated his/her understanding and acceptance.   Dental advisory given  Plan Discussed with: CRNA and Surgeon  Anesthesia Plan Comments:         Anesthesia Quick Evaluation

## 2018-07-18 NOTE — Op Note (Signed)
[facility] Patient Name: Karen Craig Procedure Date: 07/18/2018 MRN: [id number] Attending MD: Milus Banister , MD Date of Birth: 15-Aug-1966 CSN: [id number] Age: 52 Admit Type: Outpatient Procedure:                Colonoscopy Indications:              Rectal bleeding Providers:                Milus Banister, MD, Cleda Daub, RN, William Dalton, Technician Referring MD:              Medicines:                Monitored Anesthesia Care Complications:            No immediate complications. Estimated blood loss:                            None. Estimated Blood Loss:     Estimated blood loss: none. Procedure:                Pre-Anesthesia Assessment:                           - Prior to the procedure, a History and Physical                            was performed, and patient medications and                            allergies were reviewed. The patient's tolerance of                            previous anesthesia was also reviewed. The risks                            and benefits of the procedure and the sedation                            options and risks were discussed with the patient.                            All questions were answered, and informed consent                            was obtained. Prior Anticoagulants: The patient has                            taken no previous anticoagulant or antiplatelet                            agents. ASA Grade Assessment: IV - A patient with                            severe systemic disease that  is a constant threat                            to life. After reviewing the risks and benefits,                            the patient was deemed in satisfactory condition to                            undergo the procedure.                           After obtaining informed consent, the colonoscope                            was passed under direct vision. Throughout the            procedure, the patient's blood pressure, pulse, and                            oxygen saturations were monitored continuously. The                            CF-HQ190L ([id number]) Olympus adult colonoscope was                            introduced through the anus and advanced to the the                            cecum, identified by appendiceal orifice and                            ileocecal valve. The colonoscopy was performed                            without difficulty. The patient tolerated the                            procedure well. The quality of the bowel                            preparation was good. The ileocecal valve,                            appendiceal orifice, and rectum were photographed. Scope In: 10:05:58 AM Scope Out: 10:24:31 AM Scope Withdrawal Time: 0 hours 5 minutes 47 seconds  Total Procedure Duration: 0 hours 18 minutes 33 seconds  Findings:      External hemorrhoids were found. The hemorrhoids were large.      The exam was otherwise without abnormality on direct and retroflexion       views. Impression:               - External hemorrhoids. These are almost certainly  the cause of her minor rectal bleeding.                           - The examination was otherwise normal on direct                            and retroflexion views.                           - No polyps or cancers. Moderate Sedation:      N/A- Per Anesthesia Care Recommendation:           - Patient has a contact number available for                            emergencies. The signs and symptoms of potential                            delayed complications were discussed with the                            patient. Return to normal activities tomorrow.                            Written discharge instructions were provided to the                            patient.                           - Resume previous diet.                           - Continue  present medications.                           - Repeat colonoscopy in 10 years for screening. Procedure Code(s):        --- Professional ---                           [phone], Colonoscopy, flexible; diagnostic, including                            collection of specimen(s) by brushing or washing,                            when performed (separate procedure) Diagnosis Code(s):        --- Professional ---                           K64.4, Residual hemorrhoidal skin tags                           K62.5, Hemorrhage of anus and rectum CPT copyright 2017 American Medical Association. All rights reserved. The codes documented in this report are preliminary and upon coder review may  be revised to meet current  compliance requirements. Milus Banister, MD 07/18/2018 10:26:31 AM This report has been signed electronically. Number of Addenda: 0

## 2018-07-18 NOTE — Anesthesia Postprocedure Evaluation (Signed)
Anesthesia Post Note  Patient: Karen Craig  Procedure(s) Performed: COLONOSCOPY WITH PROPOFOL (N/A )     Patient location during evaluation: Endoscopy Anesthesia Type: MAC Level of consciousness: awake and alert Pain management: pain level controlled Vital Signs Assessment: post-procedure vital signs reviewed and stable Respiratory status: spontaneous breathing, nonlabored ventilation, respiratory function stable and patient connected to nasal cannula oxygen Cardiovascular status: stable and blood pressure returned to baseline Postop Assessment: no apparent nausea or vomiting Anesthetic complications: no    Last Vitals:  Vitals:   07/18/18 1031 07/18/18 1040  BP: 139/88 (!) 152/104  Pulse: 72 66  Resp: (!) 22 18  Temp: 36.6 C   SpO2: 100% 100%    Last Pain:  Vitals:   07/18/18 1040  TempSrc:   PainSc: 0-No pain                 Dhruti Ghuman

## 2018-07-18 NOTE — Transfer of Care (Signed)
Immediate Anesthesia Transfer of Care Note  Patient: Karen Craig  Procedure(s) Performed: COLONOSCOPY WITH PROPOFOL (N/A )  Patient Location: PACU  Anesthesia Type:MAC  Level of Consciousness: drowsy and patient cooperative  Airway & Oxygen Therapy: Patient Spontanous Breathing and Patient connected to face mask oxygen  Post-op Assessment: Report given to RN and Post -op Vital signs reviewed and stable  Post vital signs: Reviewed and stable  Last Vitals:  Vitals Value Taken Time  BP 139/88 07/18/2018 10:30 AM  Temp    Pulse 64 07/18/2018 10:31 AM  Resp 16 07/18/2018 10:31 AM  SpO2 100 % 07/18/2018 10:31 AM  Vitals shown include unvalidated device data.  Last Pain:  Vitals:   07/18/18 0845  TempSrc: Oral  PainSc: 0-No pain         Complications: No apparent anesthesia complications

## 2018-07-18 NOTE — Discharge Instructions (Signed)
YOU HAD AN ENDOSCOPIC PROCEDURE TODAY: Refer to the procedure report and other information in the discharge instructions given to you for any specific questions about what was found during the examination. If this information does not answer your questions, please call [place] office at [phone] to clarify.  ° °YOU SHOULD EXPECT: Some feelings of bloating in the abdomen. Passage of more gas than usual. Walking can help get rid of the air that was put into your GI tract during the procedure and reduce the bloating. If you had a lower endoscopy (such as a colonoscopy or flexible sigmoidoscopy) you may notice spotting of blood in your stool or on the toilet paper. Some abdominal soreness may be present for a day or two, also. ° °DIET: Your first meal following the procedure should be a light meal and then it is ok to progress to your normal diet. A half-sandwich or bowl of soup is an example of a good first meal. Heavy or fried foods are harder to digest and may make you feel nauseous or bloated. Drink plenty of fluids but you should avoid alcoholic beverages for 24 hours. If you had a esophageal dilation, please see attached instructions for diet.   ° °ACTIVITY: Your care partner should take you home directly after the procedure. You should plan to take it easy, moving slowly for the rest of the day. You can resume normal activity the day after the procedure however YOU SHOULD NOT DRIVE, use power tools, machinery or perform tasks that involve climbing or major physical exertion for 24 hours (because of the sedation medicines used during the test).  ° °SYMPTOMS TO REPORT IMMEDIATELY: °A gastroenterologist can be reached at any hour. Please call [phone]  for any of the following symptoms:  °Following lower endoscopy (colonoscopy, flexible sigmoidoscopy) °Excessive amounts of blood in the stool  °Significant tenderness, worsening of abdominal pains  °Swelling of the abdomen that is new, acute  °Fever of 100° or  higher  °Following upper endoscopy (EGD, EUS, ERCP, esophageal dilation) °Vomiting of blood or coffee ground material  °New, significant abdominal pain  °New, significant chest pain or pain under the shoulder blades  °Painful or persistently difficult swallowing  °New shortness of breath  °Black, tarry-looking or red, bloody stools ° °FOLLOW UP:  °If any biopsies were taken you will be contacted by phone or by letter within the next 1-3 weeks. Call [phone]  if you have not heard about the biopsies in 3 weeks.  °Please also call with any specific questions about appointments or follow up tests. ° °

## 2018-07-22 ENCOUNTER — Encounter (HOSPITAL_COMMUNITY): Payer: Self-pay | Admitting: Gastroenterology

## 2018-11-23 IMAGING — CT CT HEAD W/O CM
3 series · 15 of 47 positions shown, 18 images · non-contrast
Comparison: None.

CLINICAL DATA: 51 y/o F; vomiting today. History of paralytic
syndrome and hypertension. Focal neuro deficit, > 6 hrs, stroke
suspected right arm weakness.

EXAM:
CT HEAD WITHOUT CONTRAST
TECHNIQUE: Contiguous axial images were obtained from the base of the skull
through the vertex without intravenous contrast.

[Series 2: head trauma wo · axial · 0.41mm/px · z∈[+1729,+1864]mm · 9 of 33 slices shown, 12 images]
[im 3/33  brain]
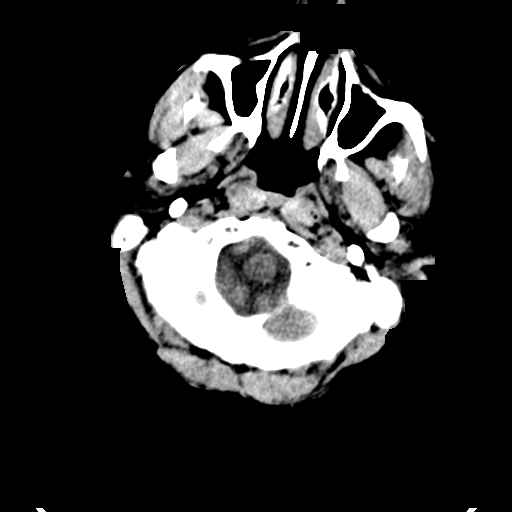
[im 3/33  bone]
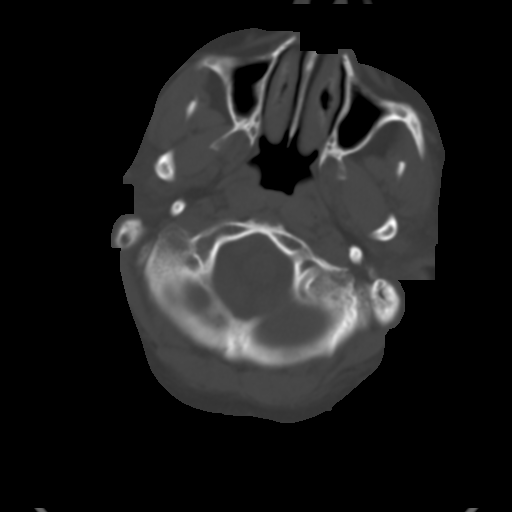
[im 6/33  brain]
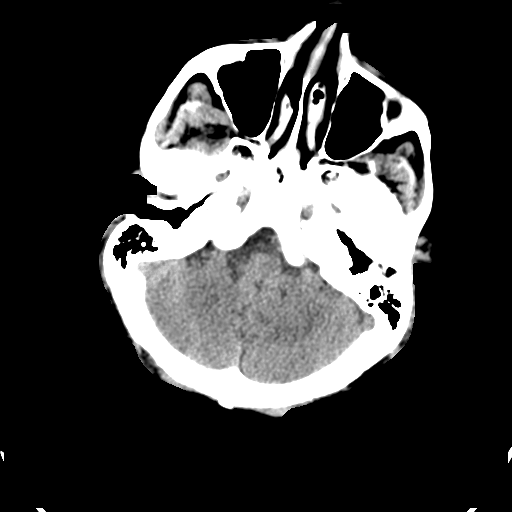
[im 9/33  brain]
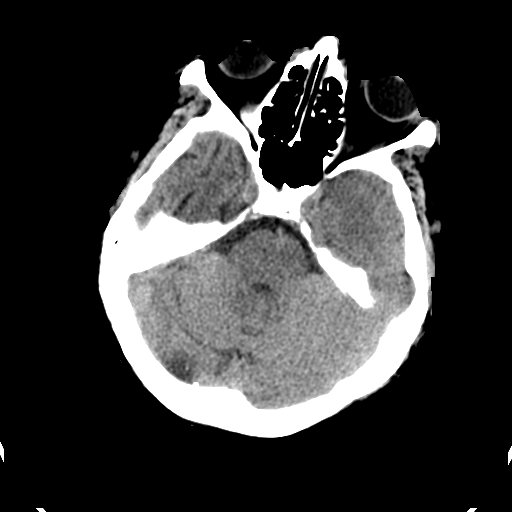
[im 13/33  brain]
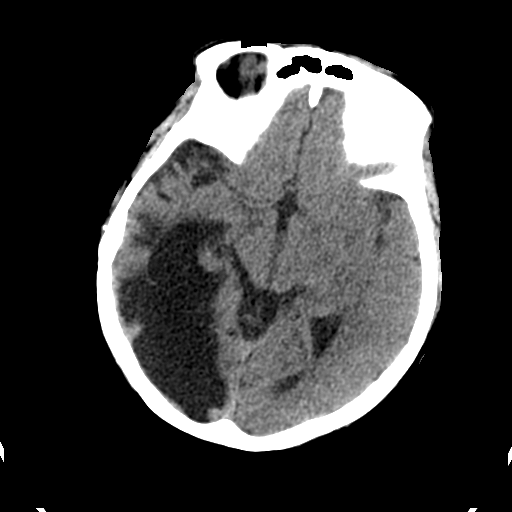
[im 17/33  brain]
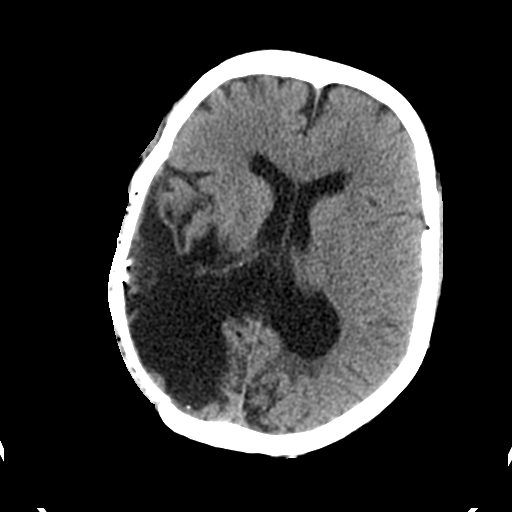
[im 17/33  bone]
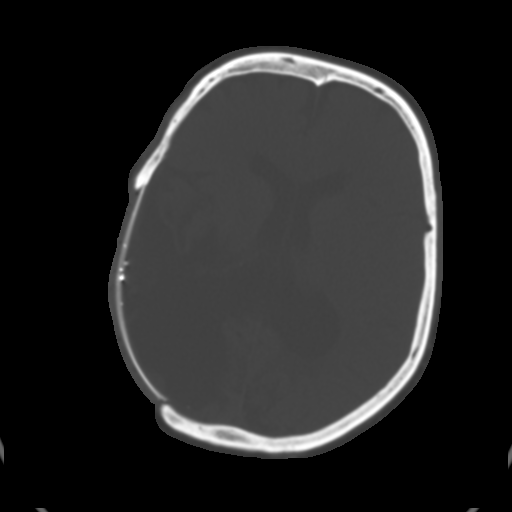
[im 20/33  brain]
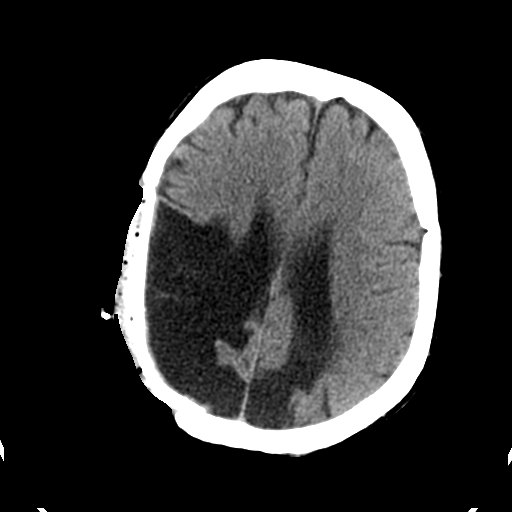
[im 24/33  brain]
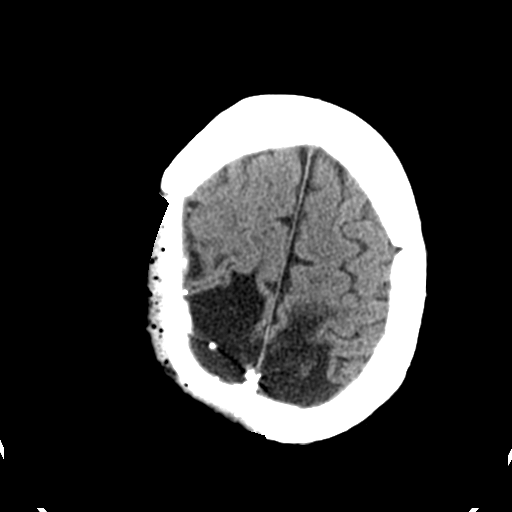
[im 27/33  brain]
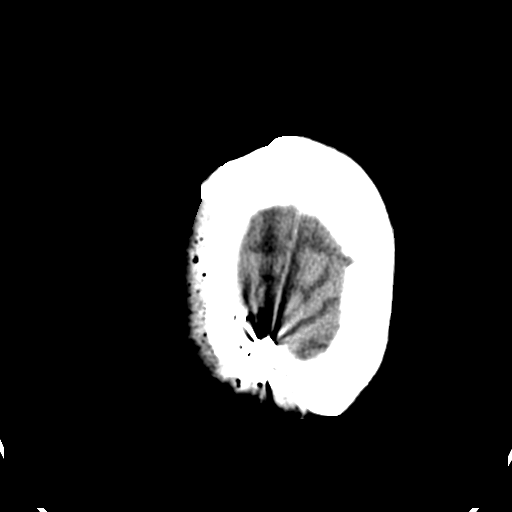
[im 30/33  brain]
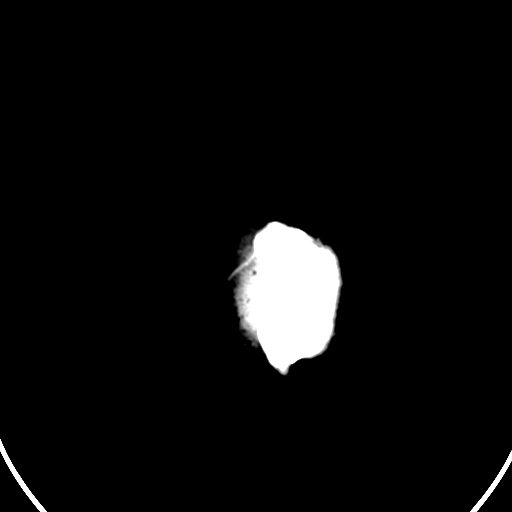
[im 30/33  bone]
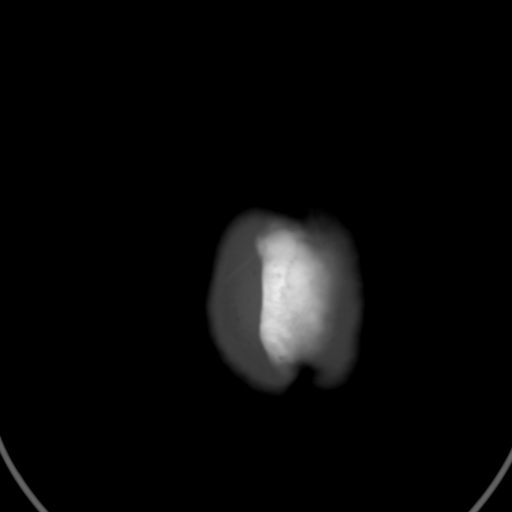

[Series 4: coronal soft tissue · coronal · 0.32mm/px · 3 of 66 slices shown]
[im 22/66  brain]
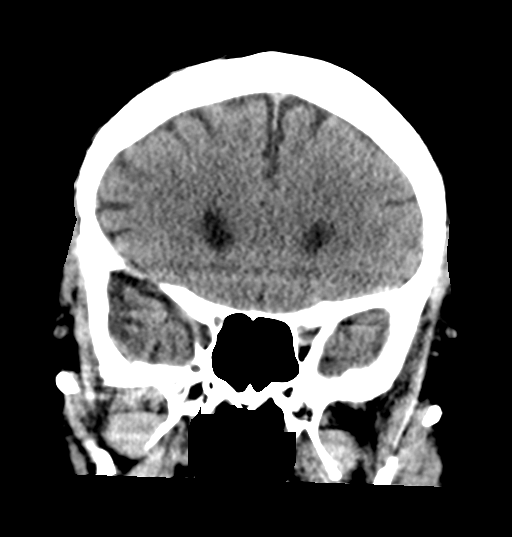
[im 29/66  brain]
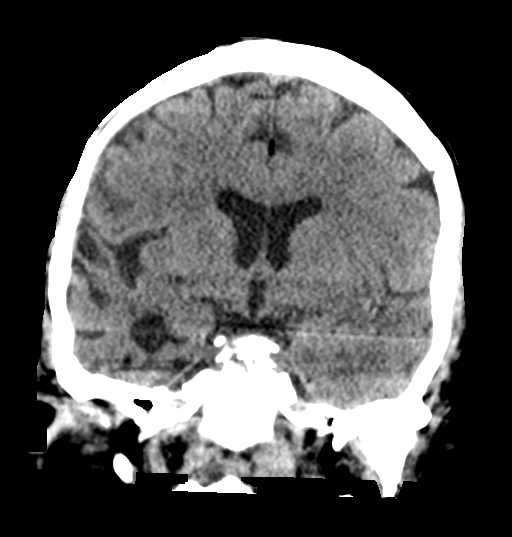
[im 37/66  brain]
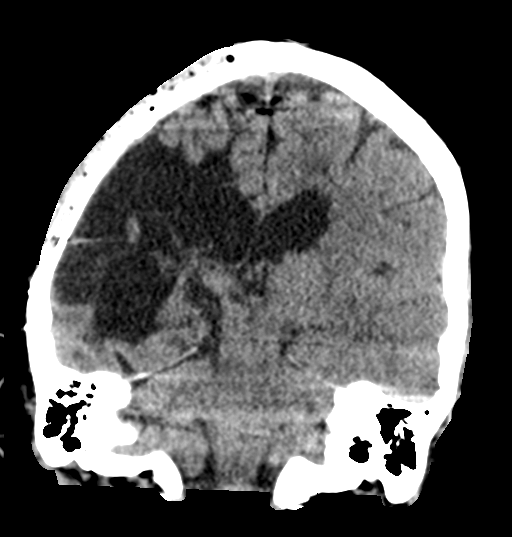

[Series 5: sagittal soft tissue · sagittal · 0.33mm/px · 3 of 56 slices shown]
[im 19/56  brain]
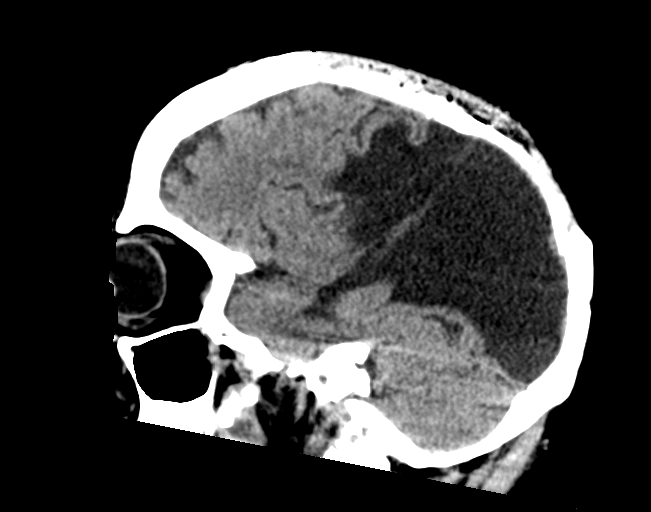
[im 28/56  brain]
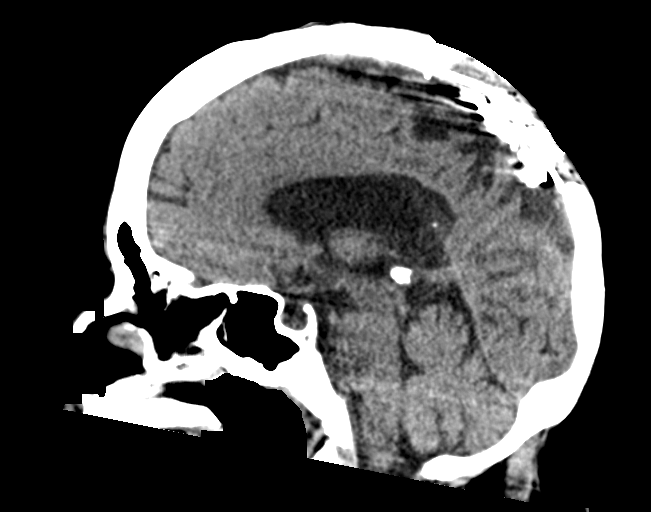
[im 37/56  brain]
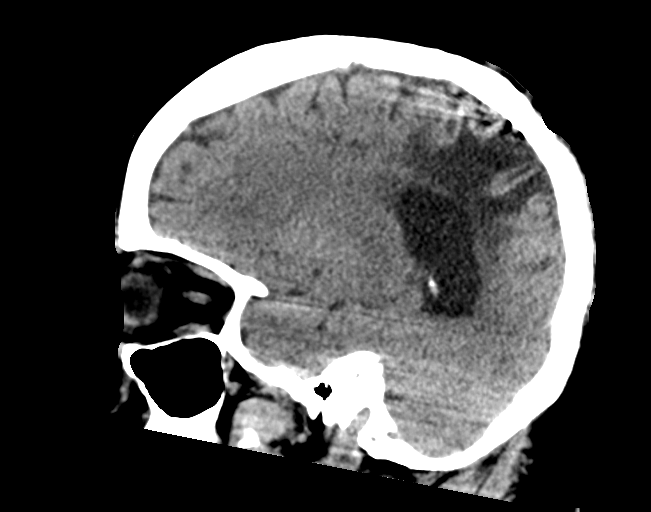

[15 of 47 positions shown; findings below may reference images not displayed]

FINDINGS: Brain: Large area of encephalomalacia of the right posterior
cerebral hemisphere and small region of encephalomalacia of the left
medial parietal lobe. Ex vacuo dilatation of the right lateral
ventricle. No acute stroke, hemorrhage, focal mass effect,
extra-axial collection, or herniation identified.

Vascular: No hyperdense vessel or unexpected calcification.

Skull: Large right posterior convexity cranioplasty chronic
postsurgical changes.

Sinuses/Orbits: No acute finding.

Other: None.
IMPRESSION: 1. No acute intracranial abnormality identified.
2. Large area of encephalomalacia of the right posterior cerebral
hemisphere and small region of encephalomalacia of the left medial
parietal lobe.

By: Aputsiaq Ziska M.D.

## 2019-07-11 ENCOUNTER — Encounter (HOSPITAL_COMMUNITY): Payer: Self-pay | Admitting: Emergency Medicine

## 2019-07-11 ENCOUNTER — Emergency Department (HOSPITAL_COMMUNITY): Payer: Medicare Other

## 2019-07-11 ENCOUNTER — Other Ambulatory Visit: Payer: Self-pay

## 2019-07-11 ENCOUNTER — Emergency Department (HOSPITAL_COMMUNITY)
Admission: EM | Admit: 2019-07-11 | Discharge: 2019-07-11 | Disposition: A | Discharge status: Home / Self Care | Payer: Medicare Other | Attending: Emergency Medicine | Admitting: Emergency Medicine

## 2019-07-11 DIAGNOSIS — M545 Low back pain: Secondary | ICD-10-CM | POA: Insufficient documentation

## 2019-07-11 DIAGNOSIS — Y9389 Activity, other specified: Secondary | ICD-10-CM | POA: Diagnosis not present

## 2019-07-11 DIAGNOSIS — I5032 Chronic diastolic (congestive) heart failure: Secondary | ICD-10-CM | POA: Diagnosis not present

## 2019-07-11 DIAGNOSIS — Y92009 Unspecified place in unspecified non-institutional (private) residence as the place of occurrence of the external cause: Secondary | ICD-10-CM

## 2019-07-11 DIAGNOSIS — Z79899 Other long term (current) drug therapy: Secondary | ICD-10-CM | POA: Diagnosis not present

## 2019-07-11 DIAGNOSIS — F17210 Nicotine dependence, cigarettes, uncomplicated: Secondary | ICD-10-CM | POA: Diagnosis not present

## 2019-07-11 DIAGNOSIS — Y92002 Bathroom of unspecified non-institutional (private) residence single-family (private) house as the place of occurrence of the external cause: Secondary | ICD-10-CM | POA: Diagnosis not present

## 2019-07-11 DIAGNOSIS — I110 Hypertensive heart disease with heart failure: Secondary | ICD-10-CM | POA: Insufficient documentation

## 2019-07-11 DIAGNOSIS — Y999 Unspecified external cause status: Secondary | ICD-10-CM | POA: Diagnosis not present

## 2019-07-11 DIAGNOSIS — R51 Headache: Secondary | ICD-10-CM | POA: Diagnosis not present

## 2019-07-11 DIAGNOSIS — M542 Cervicalgia: Secondary | ICD-10-CM | POA: Insufficient documentation

## 2019-07-11 DIAGNOSIS — W01198A Fall on same level from slipping, tripping and stumbling with subsequent striking against other object, initial encounter: Secondary | ICD-10-CM | POA: Diagnosis not present

## 2019-07-11 DIAGNOSIS — W19XXXA Unspecified fall, initial encounter: Secondary | ICD-10-CM

## 2019-07-11 NOTE — ED Notes (Signed)
Called [facility] 3 times to give report.  No answer

## 2019-07-11 NOTE — Discharge Instructions (Addendum)
Your CT scan showed an incidental finding:  "Incidental note is made of a right thyroid nodule. Ultrasound workup can be performed as clinically indicated."  Your regular medical doctor can follow up this finding. Take your usual prescriptions as previously directed.  Apply moist heat or ice to the area(s) of discomfort, for 15 minutes at a time, several times per day for the next few days.  Do not fall asleep on a heating or ice pack.  Call your regular medical doctor on Monday to schedule a follow up appointment next week.  Return to the Emergency Department immediately if worsening.

## 2019-07-11 NOTE — ED Triage Notes (Addendum)
Pt fell today, falling directly on her bottom.  C/o of right hip, right groin and lower back pain. States hitting back of her head on bathtub

## 2019-07-11 NOTE — ED Provider Notes (Signed)
[facility] Provider Note   CSN: [id number] Arrival date & time: 07/11/19  0745     History   Chief Complaint Chief Complaint  Patient presents with  . Fall    HPI [facility] is a 53 y.o. female.     The history is provided by the patient. The history is limited by the condition of the patient (memory loss).  Fall  Pt was seen at [facility]. Per pt: States she went to the bathroom to urinate, stood up and fell directly onto her bottom. Pt states she hit the back of her head against the tub. Pt c/o right sided neck pain, right sided LBP. Pt denies any other areas of pain to me to direct questioning. Denies syncope/LOC, no AMS from baseline, no CP/SOB, no abd pain, no N/V/D, no new focal motor weakness.     Past Medical History:  Diagnosis Date  . Anxiety   . At risk for falling   . Bipolar 1 disorder ([place])   . Chronic constipation   . 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 Diastolic heart failure ([place])   . Elevated liver enzymes   . Generalized anxiety disorder   . HTN (hypertension)   . Joint disorder   . Memory loss   . OA (osteoarthritis)   . OAB (overactive bladder)   . Obesity   . 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 Urge incontinence     Patient Active Problem List   Diagnosis Date Noted  . Rectal bleeding   . Hemorrhoids     Past Surgical History:  Procedure Laterality Date  . arm surgery    . BRAIN SURGERY    . COLONOSCOPY WITH PROPOFOL N/A 07/18/2018   Procedure: COLONOSCOPY WITH PROPOFOL;  Surgeon: Milus Banister, MD;  Location: [facility];  Service: Endoscopy;  Laterality: N/A;     OB History   No obstetric history on file.      Home Medications    Prior to Admission medications   Medication Sig Start Date End Date Taking? Authorizing Provider  acetaminophen (TYLENOL) 650 MG CR tablet Take 650 mg by mouth every 8 (eight) hours as needed (back pain).     [provider]   Dextromethorphan-guaiFENesin 10-100 MG/5ML liquid Take 5 mLs by mouth every 12 (twelve) hours as needed (cough).     [provider]  furosemide (LASIX) 20 MG tablet Take 20 mg by mouth daily.    [provider]  gabapentin (NEURONTIN) 100 MG capsule Take 100 mg by mouth 3 (three) times daily.    [provider]  LORazepam (ATIVAN) 0.5 MG tablet Take 0.5 mg by mouth 2 (two) times daily.     [provider]  medroxyPROGESTERone (DEPO-PROVERA) 150 MG/ML injection Inject 150 mg into the muscle every 3 (three) months.     [provider]  Multiple Vitamins-Minerals (CERTAVITE SENIOR/ANTIOXIDANT PO) Take 1 tablet by mouth daily.     [provider]  omega-3 acid ethyl esters (LOVAZA) 1 g capsule Take 1 g by mouth daily.     [provider]  PARoxetine (PAXIL) 30 MG tablet Take 30 mg by mouth daily.    [provider]  polyethylene glycol (MIRALAX / GLYCOLAX) packet Take 17 g by mouth daily.    [provider]  risperiDONE (RISPERDAL) 2 MG tablet Take 2 mg by mouth 2 (two) times daily.    [provider]  solifenacin (VESICARE)  5 MG tablet Take 5 mg by mouth daily.    [provider]    Family History No family history on file.  Social History Social History   Tobacco Use  . Smoking status: Current Every Day Smoker    Packs/day: 0.25  . Smokeless tobacco: Never Used  Substance Use Topics  . Alcohol use: Not Currently  . Drug use: Not Currently     Allergies   Patient has no known allergies.   Review of Systems Review of Systems  Unable to perform ROS: Other     Physical Exam Updated Vital Signs BP (!) 114/96 (BP Location: Left Arm)   Pulse 85   Temp 98.3 F (36.8 C) (Oral)   Resp 20   SpO2 100%   Physical Exam 0825: Physical examination: Vital signs and O2 SAT: Reviewed; Constitutional: Well developed, Well nourished, Well hydrated, In no acute distress; Head and Face:  Normocephalic, Atraumatic; Eyes: EOMI, PERRL, No scleral icterus; ENMT: Mouth and pharynx normal, Mucous membranes moist; Neck: Immobilized in C-collar, Trachea midline. No abrasions or ecchymosis.; Spine: +mild TTP right lumbar paraspinal muscles, +TTP right trapezius muscle. No abrasions or ecchymosis. No midline CS, TS, LS tenderness.; Cardiovascular: Regular rate and rhythm, No gallop; Respiratory: Breath sounds clear & equal bilaterally, No wheezes, Normal respiratory effort/excursion; Chest: Nontender, No deformity, Movement normal, No crepitus, No abrasions or ecchymosis.; Abdomen: Soft, Nontender, Nondistended, Normal bowel sounds, No abrasions or ecchymosis.; Genitourinary: No CVA tenderness;; Extremities: Full range of motion major/large joints of bilat UE's and LE's without pain or tenderness to palp, Neurovascularly intact, Pulses normal, No deformity. No deformity, no tenderness, No edema, Pelvis stable; Neuro: AA&Ox3. No facial droop. Speech clear. Left sided weakness per hx, otherwise no gross focal motor deficits in extremities.; Skin: Color normal, Warm, Dry   ED Treatments / Results  Labs (all labs ordered are listed, but only abnormal results are displayed)   EKG None  Radiology   Procedures Procedures (including critical care time)  Medications Ordered in ED Medications - No data to display   Initial Impression / Assessment and Plan / ED Course  I have reviewed the triage vital signs and the nursing notes.  Pertinent labs & imaging results that were available during my care of the patient were reviewed by me and considered in my medical decision making (see chart for details).     MDM Reviewed: previous chart, nursing note and vitals Interpretation: x-ray and CT scan   Dg Ribs Unilateral W/chest Right Result Date: 07/11/2019 CLINICAL DATA:  Recent fall with right-sided chest pain, initial encounter EXAM: RIGHT RIBS AND CHEST - 3+ VIEW COMPARISON:  None. FINDINGS:  No fracture or other bone lesions are seen involving the ribs. There is no evidence of pneumothorax or pleural effusion. Both lungs are clear. Heart size and mediastinal contours are within normal limits. IMPRESSION: No acute rib fracture is noted. Electronically Signed   By: Inez Catalina M.D.   On: 07/11/2019 09:38   Dg Lumbar Spine Complete Result Date: 07/11/2019 CLINICAL DATA:  Recent fall with low back pain, initial encounter EXAM: LUMBAR SPINE - COMPLETE 4+ VIEW COMPARISON:  None. FINDINGS: Five lumbar type vertebral bodies are well visualized. Vertebral body height is well maintained. Osteophytic changes are noted particularly at L4-5 and L5-S1. Disc space narrowing is noted L5-S1. IMPRESSION: Mild degenerative change without acute abnormality. Electronically Signed   By: Inez Catalina M.D.   On: 07/11/2019 09:41   Ct Head Wo Contrast Result Date:  07/11/2019 CLINICAL DATA:  Recent fall with headaches and neck pain, initial encounter EXAM: CT HEAD WITHOUT CONTRAST CT CERVICAL SPINE WITHOUT CONTRAST TECHNIQUE: Multidetector CT imaging of the head and cervical spine was performed following the standard protocol without intravenous contrast. Multiplanar CT image reconstructions of the cervical spine were also generated. COMPARISON:  06/27/18 FINDINGS: CT HEAD FINDINGS Brain: Large area of encephalomalacia is noted in the right parotid occipital region and left posterior parietal region consistent with the patient's known history of prior craniectomy. No focal mass lesion is seen. No findings to suggest acute hemorrhage, acute infarction or space-occupying mass lesion are seen. Vascular: No hyperdense vessel or unexpected calcification. Skull: Changes consistent with prior craniectomy on the right are seen. Sinuses/Orbits: No acute finding. Other: None. CT CERVICAL SPINE FINDINGS Alignment: Within normal limits. Skull base and vertebrae: 7 cervical segments are well visualized. Vertebral body height is well  maintained. Mild facet hypertrophic changes are noted on the left. Mild scoliotic curvature concave to the left is noted. No acute fracture or acute facet abnormality is seen. Soft tissues and spinal canal: Surrounding soft tissue structures demonstrate a hypodense nodule within the right lobe of the thyroid which measures at least 2 cm in greatest dimension. Upper chest: Visualized lung apices are within normal limits. Other: None IMPRESSION: CT of the head: Chronic changes consistent with prior craniectomy. No acute abnormality noted. CT of the cervical spine: Degenerative changes without acute abnormality. Incidental note is made of a right thyroid nodule. Ultrasound workup can be performed as clinically indicated. Electronically Signed   By: Inez Catalina M.D.   On: 07/11/2019 09:58   Ct Cervical Spine Wo Contrast Result Date: 07/11/2019 CLINICAL DATA:  Recent fall with headaches and neck pain, initial encounter EXAM: CT HEAD WITHOUT CONTRAST CT CERVICAL SPINE WITHOUT CONTRAST TECHNIQUE: Multidetector CT imaging of the head and cervical spine was performed following the standard protocol without intravenous contrast. Multiplanar CT image reconstructions of the cervical spine were also generated. COMPARISON:  06/27/18 FINDINGS: CT HEAD FINDINGS Brain: Large area of encephalomalacia is noted in the right parotid occipital region and left posterior parietal region consistent with the patient's known history of prior craniectomy. No focal mass lesion is seen. No findings to suggest acute hemorrhage, acute infarction or space-occupying mass lesion are seen. Vascular: No hyperdense vessel or unexpected calcification. Skull: Changes consistent with prior craniectomy on the right are seen. Sinuses/Orbits: No acute finding. Other: None. CT CERVICAL SPINE FINDINGS Alignment: Within normal limits. Skull base and vertebrae: 7 cervical segments are well visualized. Vertebral body height is well maintained. Mild facet  hypertrophic changes are noted on the left. Mild scoliotic curvature concave to the left is noted. No acute fracture or acute facet abnormality is seen. Soft tissues and spinal canal: Surrounding soft tissue structures demonstrate a hypodense nodule within the right lobe of the thyroid which measures at least 2 cm in greatest dimension. Upper chest: Visualized lung apices are within normal limits. Other: None IMPRESSION: CT of the head: Chronic changes consistent with prior craniectomy. No acute abnormality noted. CT of the cervical spine: Degenerative changes without acute abnormality. Incidental note is made of a right thyroid nodule. Ultrasound workup can be performed as clinically indicated. Electronically Signed   By: Inez Catalina M.D.   On: 07/11/2019 09:58   Dg Hips Bilat W Or Wo Pelvis 5 Views Result Date: 07/11/2019 CLINICAL DATA:  Recent fall with bilateral hip pain, initial encounter EXAM: DG HIP (WITH OR WITHOUT PELVIS) 5+V  BILAT COMPARISON:  None. FINDINGS: Pelvic ring is intact. No acute fracture or dislocation is noted. No soft tissue abnormality is seen. IMPRESSION: No acute abnormality noted. Electronically Signed   By: Inez Catalina M.D.   On: 07/11/2019 09:42    [facility] was evaluated in Emergency Department on 07/11/2019 for the symptoms described in the history of present illness. She was evaluated in the context of the global COVID-19 pandemic, which necessitated consideration that the patient might be at risk for infection with the SARS-CoV-2 virus that causes COVID-19. Institutional protocols and algorithms that pertain to the evaluation of patients at risk for COVID-19 are in a state of rapid change based on information released by regulatory bodies including the CDC and federal and state organizations. These policies and algorithms were followed during the patient's care in the ED.   1030:  XR/CT reassuring. Dx and testing, as well as incidental finding(s), d/w pt.  Questions  answered.  Verb understanding, agreeable to d/c home with outpt f/u.    Final Clinical Impressions(s) / ED Diagnoses   Final diagnoses:  None    ED Discharge Orders    None       Francine Graven, DO 07/14/19 1708

## 2019-08-28 ENCOUNTER — Ambulatory Visit (INDEPENDENT_AMBULATORY_CARE_PROVIDER_SITE_OTHER): Payer: Medicare Other | Admitting: Otolaryngology

## 2019-08-28 ENCOUNTER — Other Ambulatory Visit (HOSPITAL_COMMUNITY): Payer: Self-pay | Admitting: Otolaryngology

## 2019-08-28 ENCOUNTER — Other Ambulatory Visit: Payer: Self-pay

## 2019-08-28 DIAGNOSIS — D440 Neoplasm of uncertain behavior of thyroid gland: Secondary | ICD-10-CM | POA: Diagnosis not present

## 2019-08-28 DIAGNOSIS — E041 Nontoxic single thyroid nodule: Secondary | ICD-10-CM

## 2019-09-04 ENCOUNTER — Ambulatory Visit (HOSPITAL_COMMUNITY)
Admission: RE | Admit: 2019-09-04 | Discharge: 2019-09-04 | Disposition: A | Discharge status: Home / Self Care | Payer: Medicare Other | Source: Ambulatory Visit | Attending: Otolaryngology | Admitting: Otolaryngology

## 2019-09-04 ENCOUNTER — Other Ambulatory Visit: Payer: Self-pay

## 2019-09-04 DIAGNOSIS — E041 Nontoxic single thyroid nodule: Secondary | ICD-10-CM | POA: Insufficient documentation

## 2020-02-02 ENCOUNTER — Emergency Department (HOSPITAL_COMMUNITY): Payer: Medicare Other

## 2020-02-02 ENCOUNTER — Other Ambulatory Visit: Payer: Self-pay

## 2020-02-02 ENCOUNTER — Encounter (HOSPITAL_COMMUNITY): Payer: Self-pay

## 2020-02-02 ENCOUNTER — Emergency Department (HOSPITAL_COMMUNITY)
Admission: EM | Admit: 2020-02-02 | Discharge: 2020-02-02 | Disposition: A | Discharge status: Home / Self Care | Payer: Medicare Other | Attending: Emergency Medicine | Admitting: Emergency Medicine

## 2020-02-02 DIAGNOSIS — T07XXXA Unspecified multiple injuries, initial encounter: Secondary | ICD-10-CM

## 2020-02-02 DIAGNOSIS — M25511 Pain in right shoulder: Secondary | ICD-10-CM | POA: Insufficient documentation

## 2020-02-02 DIAGNOSIS — Y9389 Activity, other specified: Secondary | ICD-10-CM | POA: Diagnosis not present

## 2020-02-02 DIAGNOSIS — Z8673 Personal history of transient ischemic attack (TIA), and cerebral infarction without residual deficits: Secondary | ICD-10-CM | POA: Insufficient documentation

## 2020-02-02 DIAGNOSIS — W050XXA Fall from non-moving wheelchair, initial encounter: Secondary | ICD-10-CM | POA: Diagnosis not present

## 2020-02-02 DIAGNOSIS — Y92128 Other place in nursing home as the place of occurrence of the external cause: Secondary | ICD-10-CM | POA: Diagnosis not present

## 2020-02-02 DIAGNOSIS — M25572 Pain in left ankle and joints of left foot: Secondary | ICD-10-CM | POA: Insufficient documentation

## 2020-02-02 DIAGNOSIS — Y998 Other external cause status: Secondary | ICD-10-CM | POA: Diagnosis not present

## 2020-02-02 DIAGNOSIS — M79641 Pain in right hand: Secondary | ICD-10-CM | POA: Insufficient documentation

## 2020-02-02 DIAGNOSIS — I10 Essential (primary) hypertension: Secondary | ICD-10-CM | POA: Diagnosis not present

## 2020-02-02 DIAGNOSIS — F17210 Nicotine dependence, cigarettes, uncomplicated: Secondary | ICD-10-CM | POA: Diagnosis not present

## 2020-02-02 DIAGNOSIS — M79672 Pain in left foot: Secondary | ICD-10-CM | POA: Insufficient documentation

## 2020-02-02 DIAGNOSIS — Z79899 Other long term (current) drug therapy: Secondary | ICD-10-CM | POA: Diagnosis not present

## 2020-02-02 NOTE — ED Triage Notes (Signed)
Pt brought from [facility] with complaints of right  arm pain, hand pain  then started complaining of leg pain. Reported that pt fell out of wheelchair on Saturday but refused tranport

## 2020-02-02 NOTE — ED Provider Notes (Signed)
[facility] EMERGENCY DEPARTMENT Provider Note   CSN: [id number] Arrival date & time: 02/02/20  1348     History Chief Complaint  Patient presents with  . Extremity Weakness  . Leg Pain    LATORRA LORMAN is a 54 y.o. female.  HPI   This patient is a 54 year old female, she has known history of stroke with left-sided hemiplegia, she is in a wheelchair, she also has bipolar disorder.  Evidently the patient had a fall out of her wheelchair 2 days ago, she gives me 2 different stories, one where she fell and the other where her hand got caught in a door that closed on it.  She now complains of right hand pain, right shoulder pain and left foot and ankle pain.  She denies any obvious deformities, states she did not lose consciousness, states she did bump her head but has been doing fine with regards to her head.  She is brought in by paramedics for evaluation from her facility where she lives at [facility].  She denies having any medications prior to arrival, states that she has no associated swelling or redness of the joints or arms or legs  Past Medical History:  Diagnosis Date  . Anxiety   . At risk for falling   . Bipolar 1 disorder ([place])   . Chronic constipation   . 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 Diastolic heart failure ([place])   . Elevated liver enzymes   . Generalized anxiety disorder   . HTN (hypertension)   . Joint disorder   . Memory loss   . OA (osteoarthritis)   . OAB (overactive bladder)   . Obesity   . 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 Urge incontinence     Patient Active Problem List   Diagnosis Date Noted  . Rectal bleeding   . Hemorrhoids     Past Surgical History:  Procedure Laterality Date  . arm surgery    . BRAIN SURGERY    . COLONOSCOPY WITH PROPOFOL N/A 07/18/2018   Procedure: COLONOSCOPY WITH PROPOFOL;  Surgeon: Milus Banister, MD;  Location: [facility];  Service: Endoscopy;  Laterality: N/A;     OB History   No  obstetric history on file.     No family history on file.  Social History   Tobacco Use  . Smoking status: Current Every Day Smoker    Packs/day: 0.25  . Smokeless tobacco: Never Used  Substance Use Topics  . Alcohol use: Not Currently  . Drug use: Not Currently    Home Medications Prior to Admission medications   Medication Sig Start Date End Date Taking? Authorizing Provider  acetaminophen (TYLENOL) 650 MG CR tablet Take 650 mg by mouth every 8 (eight) hours as needed (back pain).     [provider]  Dextromethorphan-guaiFENesin 10-100 MG/5ML liquid Take 5 mLs by mouth every 12 (twelve) hours as needed (cough).     [provider]  furosemide (LASIX) 20 MG tablet Take 20 mg by mouth daily.    [provider]  gabapentin (NEURONTIN) 100 MG capsule Take 100 mg by mouth 3 (three) times daily.    [provider]  LORazepam (ATIVAN) 0.5 MG tablet Take 0.5 mg by mouth 2 (two) times daily.     [provider]  medroxyPROGESTERone (DEPO-PROVERA) 150 MG/ML injection Inject 150 mg into the muscle every 3 (three) months.     [provider]  Multiple Vitamins-Minerals (  CERTAVITE SENIOR/ANTIOXIDANT PO) Take 1 tablet by mouth daily.     [provider]  omega-3 acid ethyl esters (LOVAZA) 1 g capsule Take 1 g by mouth daily.     [provider]  PARoxetine (PAXIL) 30 MG tablet Take 30 mg by mouth daily.    [provider]  polyethylene glycol (MIRALAX / GLYCOLAX) packet Take 17 g by mouth daily.    [provider]  risperiDONE (RISPERDAL) 2 MG tablet Take 2 mg by mouth 2 (two) times daily.    [provider]  solifenacin (VESICARE) 5 MG tablet Take 5 mg by mouth daily.    [provider]    Allergies    Patient has no known allergies.  Review of Systems   Review of Systems  Musculoskeletal: Positive for arthralgias. Negative for back pain, joint swelling and neck pain.       Joint  pain  Skin: Negative for rash and wound.  Neurological: Negative for headaches.    Physical Exam Updated Vital Signs BP 135/80 (BP Location: Right Arm)   Pulse 71   Temp 98.2 F (36.8 C) (Oral)   Resp 18   Ht 1.651 m (5\' 5" )   Wt 74 kg   SpO2 100%   BMI 27.15 kg/m   Physical Exam Vitals and nursing note reviewed.  Constitutional:      Appearance: She is well-developed. She is not diaphoretic.  HENT:     Head: Normocephalic and atraumatic.     Comments: No signs of acute trauma to the head, no swelling edema abrasions lacerations or contusions Eyes:     General:        Right eye: No discharge.        Left eye: No discharge.     Conjunctiva/sclera: Conjunctivae normal.  Pulmonary:     Effort: Pulmonary effort is normal. No respiratory distress.  Musculoskeletal:     Comments: The patient has some weakness of the left arm and the left leg, there is no obvious deformity about the left ankle and foot or the right hand wrist elbow or shoulder but she does point out tenderness to the dorsum of the right hand of the right shoulder.  She has normal range of motion of all of the structures.  I have palpated upper spine and there is no tenderness over the cervical thoracic or lumbar spines.  She has no obvious leg length discrepancies and the compartments are diffusely soft  Skin:    General: Skin is warm and dry.     Findings: No erythema or rash.  Neurological:     Mental Status: She is alert.     Coordination: Coordination normal.     Comments: Awake alert and following commands is the best that she can with her right side, she has left-sided weakness which is chronic     ED Results / Procedures / Treatments   Labs (all labs ordered are listed, but only abnormal results are displayed) Labs Reviewed - No data to display  EKG None  Radiology DG Shoulder Right  Result Date: 02/02/2020 CLINICAL DATA:  Patient with right shoulder pain status post fall. EXAM: RIGHT SHOULDER -  2+ VIEW COMPARISON:  None. FINDINGS: Normal anatomic alignment. Mild AC joint degenerative changes. No evidence for acute displaced fracture. Visualized right hemithorax is unremarkable. IMPRESSION: No acute osseous abnormality. Electronically Signed   By: Lovey Newcomer M.D.   On: 02/02/2020 14:56   DG Hand Complete Right  Result  Date: 02/02/2020 CLINICAL DATA:  Pain status post fall. EXAM: RIGHT HAND - COMPLETE 3+ VIEW COMPARISON:  None. FINDINGS: There is no evidence of fracture or dislocation. There is no evidence of arthropathy or other focal bone abnormality. Soft tissues are unremarkable. IMPRESSION: Negative. Electronically Signed   By: Constance Holster M.D.   On: 02/02/2020 14:56   DG Foot Complete Left  Result Date: 02/02/2020 CLINICAL DATA:  Right foot pain. The patient fell out of a wheelchair 01/31/2020. EXAM: LEFT FOOT - COMPLETE 3+ VIEW COMPARISON:  None. FINDINGS: There is no acute fracture or dislocation. Healed distal tibial fracture is partially imaged. Subtalar osteoarthritis is present. Soft tissues are unremarkable. IMPRESSION: No acute abnormality. Electronically Signed   By: Inge Rise M.D.   On: 02/02/2020 14:56    Procedures Procedures (including critical care time)  Medications Ordered in ED Medications - No data to display  ED Course  I have reviewed the triage vital signs and the nursing notes.  Pertinent labs & imaging results that were available during my care of the patient were reviewed by me and considered in my medical decision making (see chart for details).    MDM Rules/Calculators/A&P                      This patient has signs of minor trauma but at this time other than plain films I do not think she needs CT scans.  She does not appear acutely ill, has a normal mental status for her and her neurologic exam is consistent with baseline given her prior stroke.  X-rays were all negative for fracture, stable for discharge  Final Clinical  Impression(s) / ED Diagnoses Final diagnoses:  Contusion of multiple sites    Rx / DC Orders ED Discharge Orders    None       Noemi Chapel, MD 02/02/20 1524

## 2020-02-02 NOTE — Discharge Instructions (Signed)
Your testing shows no signs of broken bones, Tylenol or ibuprofen as needed

## 2020-02-02 NOTE — ED Notes (Signed)
Ems called to transport pt back to [facility].

## 2020-04-26 ENCOUNTER — Emergency Department (HOSPITAL_COMMUNITY): Payer: Medicare Other

## 2020-04-26 ENCOUNTER — Emergency Department (HOSPITAL_COMMUNITY)
Admission: EM | Admit: 2020-04-26 | Discharge: 2020-04-26 | Disposition: A | Discharge status: Skilled Nursing Facility | Payer: Medicare Other | Attending: Emergency Medicine | Admitting: Emergency Medicine

## 2020-04-26 ENCOUNTER — Encounter (HOSPITAL_COMMUNITY): Payer: Self-pay

## 2020-04-26 DIAGNOSIS — I5030 Unspecified diastolic (congestive) heart failure: Secondary | ICD-10-CM | POA: Diagnosis not present

## 2020-04-26 DIAGNOSIS — I110 Hypertensive heart disease with heart failure: Secondary | ICD-10-CM | POA: Diagnosis not present

## 2020-04-26 DIAGNOSIS — F17210 Nicotine dependence, cigarettes, uncomplicated: Secondary | ICD-10-CM | POA: Diagnosis not present

## 2020-04-26 DIAGNOSIS — M25552 Pain in left hip: Secondary | ICD-10-CM | POA: Diagnosis not present

## 2020-04-26 DIAGNOSIS — M25551 Pain in right hip: Secondary | ICD-10-CM | POA: Diagnosis not present

## 2020-04-26 DIAGNOSIS — Y9389 Activity, other specified: Secondary | ICD-10-CM | POA: Insufficient documentation

## 2020-04-26 DIAGNOSIS — Y999 Unspecified external cause status: Secondary | ICD-10-CM | POA: Insufficient documentation

## 2020-04-26 DIAGNOSIS — Y92128 Other place in nursing home as the place of occurrence of the external cause: Secondary | ICD-10-CM | POA: Insufficient documentation

## 2020-04-26 DIAGNOSIS — S0990XA Unspecified injury of head, initial encounter: Secondary | ICD-10-CM | POA: Diagnosis present

## 2020-04-26 DIAGNOSIS — W050XXA Fall from non-moving wheelchair, initial encounter: Secondary | ICD-10-CM | POA: Insufficient documentation

## 2020-04-26 DIAGNOSIS — W19XXXA Unspecified fall, initial encounter: Secondary | ICD-10-CM

## 2020-04-26 DIAGNOSIS — M79602 Pain in left arm: Secondary | ICD-10-CM | POA: Diagnosis not present

## 2020-04-26 MED ORDER — DICLOFENAC SODIUM 1 % EX GEL: 2.0000 g | Freq: Four times a day (QID) | CUTANEOUS | 0 refills | Status: DC | PRN

## 2020-04-26 NOTE — ED Triage Notes (Signed)
Pt from [facility] . Pt reports she was in her [place] smoking outside of facility and W/C rolled down hill and fell out WC. Pt reported to EMS that both arms hurt and tail hurts. Has hx of stroke with left hemiplegia

## 2020-04-26 NOTE — ED Provider Notes (Signed)
Emergency Department Provider Note   I have reviewed the triage vital signs and the nursing notes.   HISTORY  Chief Complaint Arm Pain and Fall   HPI Karen Craig is a 54 y.o. female presents to the emergency department from [place] facility by EMS after mechanical fall from her wheelchair today.  Patient states that she was outside in her wheelchair when it suddenly began to roll and she ended up falling out of their wheelchair.  She landed primarily on her left side.  She is not sure about head injury but does not think so.  She did not lose consciousness.  She did not experience chest pain, palpitations, shortness of breath, lightheadedness prior to falling.  She is having pain mainly on the left side of her body including her shoulder and left wrist along with her left ankle.  She is also hurting in her pelvis area.  She denies any headache, numbness, vision change. 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   Past Medical History:  Diagnosis Date  . Anxiety   . At risk for falling   . Bipolar 1 disorder ([place])   . Chronic constipation   . CVA (cerebral vascular accident) ([place])    left hemiplegia  . Diastolic heart failure ([place])   . Elevated liver enzymes   . Generalized anxiety disorder   . HTN (hypertension)   . Joint disorder   . Memory loss   . OA (osteoarthritis)   . OAB (overactive bladder)   . Obesity   . 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 Urge incontinence     Patient Active Problem List   Diagnosis Date Noted  . Rectal bleeding   . Hemorrhoids     Past Surgical History:  Procedure Laterality Date  . arm surgery    . BRAIN SURGERY    . COLONOSCOPY WITH PROPOFOL N/A 07/18/2018   Procedure: COLONOSCOPY WITH PROPOFOL;  Surgeon: Milus Banister, MD;  Location: [facility];  Service: Endoscopy;  Laterality: N/A;    Allergies Patient has no known allergies.  No family history on file.  Social History Social History    Tobacco Use  . Smoking status: Current Every Day Smoker    Packs/day: 0.25  . Smokeless tobacco: Never Used  Vaping Use  . Vaping Use: Never used  Substance Use Topics  . Alcohol use: Not Currently  . Drug use: Not Currently    Review of Systems  Constitutional: No fever/chills Eyes: No visual changes. Cardiovascular: Denies chest pain. Respiratory: Denies shortness of breath. Gastrointestinal: No abdominal pain.  No nausea, no vomiting. Musculoskeletal: Negative for back pain. Positive left shoulder and wrist pain. Positive hip pain bilaterally with left ankle pain.  Skin: Negative for rash. Neurological: Negative for headaches.  10-point ROS otherwise negative.  ____________________________________________   PHYSICAL EXAM:  VITAL SIGNS: ED Triage Vitals  Enc Vitals Group     BP 04/26/20 0956 (!) 82/43     Pulse Rate 04/26/20 0956 74     Resp 04/26/20 0956 16     Temp 04/26/20 0956 98.8 F (37.1 C)     Temp Source 04/26/20 0956 Oral     SpO2 04/26/20 0956 98 %   Constitutional: Alert and oriented. Well appearing and in no acute distress. Eyes: Conjunctivae are normal. Head: Atraumatic. Nose: No congestion/rhinnorhea. Mouth/Throat: Mucous membranes are moist. Neck: No stridor.  Cardiovascular: Normal rate, regular rhythm. Good peripheral circulation. Grossly normal heart sounds.  Respiratory: Normal respiratory effort.  No retractions. Lungs CTAB. Gastrointestinal: Soft and nontender. No distention.  Musculoskeletal: Mild discomfort with passive range of motion of the left wrist and shoulder.  No deformity.  No evidence of septic joint.  Normal range of motion without tenderness of the left elbow.  Normal movement of the right upper extremity.  Normal passive range of motion of the bilateral hips, knees with mild tenderness over the left ankle.  No swelling or bruising.  Neurologic:  Baseline dysarthria. Baseline left hemiplegia. No new neuro deficits.  Skin:   Skin is warm, dry and intact. No rash noted.  ____________________________________________  RADIOLOGY  CT imaging and plain films reviewed.  ____________________________________________   PROCEDURES  Procedure(s) performed:   Procedures  None ____________________________________________   INITIAL IMPRESSION / ASSESSMENT AND PLAN / ED COURSE  Pertinent labs & imaging results that were available during my care of the patient were reviewed by me and considered in my medical decision making (see chart for details).   Patient presents emergency department for evaluation after mechanical fall from a wheelchair.  She does not have any outward sign of head trauma, altered mental status, new neuro deficit.  Her baseline neuro deficits are present and noted.  He is having tenderness in several areas in her extremities mainly on the left.  Patient's initial blood pressures on arrival to the emergency department are recorded as low.  I have repeated blood pressures on my evaluation at the bedside and her blood pressures are normal in the 165-790 systolic range.  The blood pressure cuff when I found her was folded over and also reading through the sleeve of her shirt.  I doubt that those blood pressures are real.  We will continue to follow pressures here but no intervention or further w/u at this time.   CT imaging and plain films reviewed. No hypotension or other symptoms here. No acute fractures or other acute findings. Plan for return to nursing facility.  ____________________________________________  FINAL CLINICAL IMPRESSION(S) / ED DIAGNOSES  Final diagnoses:  Fall, initial encounter  Injury of head, initial encounter  Left arm pain  Bilateral hip pain    NEW OUTPATIENT MEDICATIONS STARTED DURING THIS VISIT:  Discharge Medication List as of 04/26/2020 12:00 PM    START taking these medications   Details  diclofenac Sodium (VOLTAREN) 1 % GEL Apply 2 g topically 4 (four) times  daily as needed., Starting Mon 04/26/2020, Print        Note:  This document was prepared using Dragon voice recognition software and may include unintentional dictation errors.  Nanda Quinton, MD, [facility] Emergency Medicine    Nyx Keady, Wonda Olds, MD 04/27/20 1155

## 2020-04-26 NOTE — Discharge Instructions (Signed)
You were seen in the emergency department today with pain after a fall.  Your x-rays and CT scans did not show any bleeding or broken bones.  Please use the Voltaren gel to apply to painful areas as needed.  Try and keep your normal level of activity.  You may experience ongoing soreness and stiffness for the next several days.  Please follow with your primary care doctor and return to the emergency department any new or suddenly worsening symptoms.

## 2020-08-24 ENCOUNTER — Encounter (HOSPITAL_COMMUNITY): Payer: Self-pay

## 2020-08-24 ENCOUNTER — Other Ambulatory Visit: Payer: Self-pay

## 2020-08-24 ENCOUNTER — Emergency Department (HOSPITAL_COMMUNITY)
Admission: EM | Admit: 2020-08-24 | Discharge: 2020-08-25 | Disposition: A | Discharge status: Home / Self Care | Payer: Medicare Other | Attending: Emergency Medicine | Admitting: Emergency Medicine

## 2020-08-24 ENCOUNTER — Emergency Department (HOSPITAL_COMMUNITY): Payer: Medicare Other

## 2020-08-24 DIAGNOSIS — G8194 Hemiplegia, unspecified affecting left nondominant side: Secondary | ICD-10-CM | POA: Diagnosis not present

## 2020-08-24 DIAGNOSIS — M25472 Effusion, left ankle: Secondary | ICD-10-CM | POA: Insufficient documentation

## 2020-08-24 DIAGNOSIS — W19XXXA Unspecified fall, initial encounter: Secondary | ICD-10-CM

## 2020-08-24 DIAGNOSIS — I5030 Unspecified diastolic (congestive) heart failure: Secondary | ICD-10-CM | POA: Diagnosis not present

## 2020-08-24 DIAGNOSIS — J449 Chronic obstructive pulmonary disease, unspecified: Secondary | ICD-10-CM | POA: Diagnosis not present

## 2020-08-24 DIAGNOSIS — W050XXA Fall from non-moving wheelchair, initial encounter: Secondary | ICD-10-CM | POA: Diagnosis not present

## 2020-08-24 DIAGNOSIS — Z79899 Other long term (current) drug therapy: Secondary | ICD-10-CM | POA: Insufficient documentation

## 2020-08-24 DIAGNOSIS — S8002XA Contusion of left knee, initial encounter: Secondary | ICD-10-CM

## 2020-08-24 DIAGNOSIS — S96912A Strain of unspecified muscle and tendon at ankle and foot level, left foot, initial encounter: Secondary | ICD-10-CM

## 2020-08-24 DIAGNOSIS — F17200 Nicotine dependence, unspecified, uncomplicated: Secondary | ICD-10-CM | POA: Insufficient documentation

## 2020-08-24 DIAGNOSIS — I110 Hypertensive heart disease with heart failure: Secondary | ICD-10-CM | POA: Insufficient documentation

## 2020-08-24 DIAGNOSIS — R52 Pain, unspecified: Secondary | ICD-10-CM | POA: Diagnosis present

## 2020-08-24 DIAGNOSIS — S93402A Sprain of unspecified ligament of left ankle, initial encounter: Secondary | ICD-10-CM

## 2020-08-24 HISTORY — DX: Chronic obstructive pulmonary disease, unspecified: J44.9

## 2020-08-24 HISTORY — DX: Schizophrenia, unspecified: F20.9

## 2020-08-24 NOTE — ED Notes (Signed)
Pt given a meal tray at this time.

## 2020-08-24 NOTE — Discharge Instructions (Signed)
Return if any problems.

## 2020-08-24 NOTE — ED Notes (Signed)
[facility] updated on lack of transportation and plan of holding pt until transportation becomes available. Will update if plan changes.

## 2020-08-24 NOTE — ED Notes (Signed)
Patient transported to X-ray 

## 2020-08-24 NOTE — ED Triage Notes (Signed)
Facility reports pt slid out of her wheelchair but fall was unwitnessed.  Reports pt has history of confusion.  C/O pain in left hip, knee, ankle, left arm, and left hand.  Reports is paralyzed on left side.  EMS reports vss.  Pt alert, oriented to self and place at baseline.

## 2020-08-24 NOTE — ED Provider Notes (Signed)
[facility] EMERGENCY DEPARTMENT Provider Note   CSN: [id number] Arrival date & time: 08/24/20  9892     History Chief Complaint  Patient presents with  . [place] is a 54 y.o. female.  The history is provided by the patient. No language interpreter was used.  Fall This is a new problem. The current episode started 6 to 12 hours ago. The problem has not changed since onset.Nothing aggravates the symptoms. Nothing relieves the symptoms. She has tried nothing for the symptoms.   Pt reports she fell out of her wheelchair.  Pt reports she landed on her foot and knee.  Pt reports she hurts on her left side,  No impact of head.     Past Medical History:  Diagnosis Date  . Anxiety   . At risk for falling   . Bipolar 1 disorder ([place])   . Chronic constipation   . COPD (chronic obstructive pulmonary disease) ([place])   . CVA (cerebral vascular accident) ([place])    left hemiplegia  . Diastolic heart failure ([place])   . Elevated liver enzymes   . Generalized anxiety disorder   . HTN (hypertension)   . Joint disorder   . Memory loss   . OA (osteoarthritis)   . OAB (overactive bladder)   . Obesity   . 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 Schizophrenia ([place])   . Urge incontinence     Patient Active Problem List   Diagnosis Date Noted  . Rectal bleeding   . Hemorrhoids     Past Surgical History:  Procedure Laterality Date  . arm surgery    . BRAIN SURGERY    . COLONOSCOPY WITH PROPOFOL N/A 07/18/2018   Procedure: COLONOSCOPY WITH PROPOFOL;  Surgeon: Milus Banister, MD;  Location: [facility];  Service: Endoscopy;  Laterality: N/A;     OB History   No obstetric history on file.     No family history on file.  Social History   Tobacco Use  . Smoking status: Current Every Day Smoker    Packs/day: 0.25  . Smokeless tobacco: Never Used  Vaping Use  . Vaping Use: Never used  Substance Use Topics  . Alcohol use: Not Currently  . Drug use: Not Currently     Home Medications Prior to Admission medications   Medication Sig Start Date End Date Taking? Authorizing Provider  acetaminophen (TYLENOL) 650 MG CR tablet Take 650 mg by mouth every 8 (eight) hours as needed (back pain).    Yes [provider]  Dextromethorphan-guaiFENesin 10-100 MG/5ML liquid Take 5 mLs by mouth every 12 (twelve) hours as needed (cough).    Yes [provider]  dicyclomine (BENTYL) 20 MG tablet Take 20 mg by mouth 2 (two) times daily. 06/21/20  Yes [provider]  famotidine (PEPCID) 20 MG tablet Take 20 mg by mouth daily. 08/06/20  Yes [provider]  fenofibrate (TRICOR) 145 MG tablet Take 145 mg by mouth daily. 08/09/20  Yes [provider]  fluticasone (FLONASE) 50 MCG/ACT nasal spray Place 2 sprays into both nostrils daily. 08/04/20  Yes [provider]  furosemide (LASIX) 20 MG tablet Take 20 mg by mouth daily.   Yes [provider]  gabapentin (NEURONTIN) 100 MG capsule Take 100 mg by mouth 3 (three) times daily.   Yes [provider]  hydrocortisone (ANUSOL-HC) 2.5 % rectal cream Place 1 application rectally in the morning and at bedtime. 08/11/20  Yes [provider]  LORazepam (ATIVAN) 0.5 MG tablet Take 0.5 mg by mouth 2 (two) times daily.    Yes [provider]  medroxyPROGESTERone (DEPO-PROVERA) 150 MG/ML injection Inject 150 mg into the muscle every 3 (three) months.    Yes [provider]  Multiple Vitamins-Minerals (CERTAVITE SENIOR/ANTIOXIDANT PO) Take 1 tablet by mouth daily.    Yes [provider]  oxybutynin (DITROPAN-XL) 5 MG 24 hr tablet Take 5 mg by mouth daily. 08/06/20  Yes [provider]  PARoxetine (PAXIL) 30 MG tablet Take 30 mg by mouth daily.   Yes [provider]  polyethylene glycol (MIRALAX / GLYCOLAX) packet Take 17 g by mouth daily.   Yes [provider]  RESTASIS 0.05 % ophthalmic emulsion Place 1 drop into  both eyes daily. 08/11/20  Yes [provider]  risperiDONE (RISPERDAL) 2 MG tablet Take 2 mg by mouth 2 (two) times daily.   Yes [provider]  Vitamins A & D (VITAMIN A & D) ointment Apply 1 application topically as needed for dry skin.   Yes [provider]  Jay Schlichter Oil (ARTIFICIAL TEARS) ointment Place 1 drop into both eyes in the morning and at bedtime.   Yes [provider]  diclofenac Sodium (VOLTAREN) 1 % GEL Apply 2 g topically 4 (four) times daily as needed. Patient not taking: Reported on 08/24/2020 04/26/20   Long, Wonda Olds, MD  doxycycline (VIBRA-TABS) 100 MG tablet Take 100 mg by mouth 2 (two) times daily. Patient not taking: Reported on 08/24/2020 08/11/20   [provider]  omega-3 acid ethyl esters (LOVAZA) 1 g capsule Take 1 g by mouth daily.  Patient not taking: Reported on 08/24/2020    [provider]  solifenacin (VESICARE) 5 MG tablet Take 5 mg by mouth daily. Patient not taking: Reported on 08/24/2020    [provider]    Allergies    Patient has no known allergies.  Review of Systems   Review of Systems  All other systems reviewed and are negative.   Physical Exam Updated Vital Signs BP (!) 149/63 (BP Location: Right Arm)   Pulse 82   Temp 98.6 F (37 C) (Oral)   Resp 16   Wt 78.1 kg   SpO2 100%   BMI 28.64 kg/m   Physical Exam Vitals and nursing note reviewed.  Constitutional:      Appearance: She is well-developed.  HENT:     Head: Normocephalic.  Cardiovascular:     Rate and Rhythm: Normal rate.  Pulmonary:     Effort: Pulmonary effort is normal.  Abdominal:     General: Abdomen is flat. There is no distension.  Musculoskeletal:        General: No deformity.     Cervical back: Normal range of motion.     Comments: No deformity or swelling to arm   Skin:    General: Skin is warm.  Neurological:     Mental Status: She is alert.     Comments: Paralysis left  side,  Swollen left ankle,  Pain with range of motion  nv and ns intact   Psychiatric:        Mood and Affect: Mood normal.     ED Results / Procedures / Treatments   Labs (all labs ordered are listed, but only abnormal results are displayed) Labs Reviewed - No data to display  EKG None  Radiology DG Tibia/Fibula Left  Result Date: 08/24/2020 CLINICAL DATA:  Fall out  of wheelchair. EXAM: LEFT TIBIA AND FIBULA - 2 VIEW COMPARISON:  April 26, 2020. FINDINGS: Old healed distal left tibial fracture is noted. No acute fracture or dislocation is noted. Soft tissues are unremarkable. IMPRESSION: Negative. Electronically Signed   By: Marijo Conception M.D.   On: 08/24/2020 13:44   DG Foot Complete Left  Result Date: 08/24/2020 CLINICAL DATA:  Left foot pain after fall out of wheelchair. EXAM: LEFT FOOT - COMPLETE 3+ VIEW COMPARISON:  None. FINDINGS: There is no evidence of fracture or dislocation. There is no evidence of arthropathy or other focal bone abnormality. Soft tissues are unremarkable. IMPRESSION: Negative. Electronically Signed   By: Marijo Conception M.D.   On: 08/24/2020 13:53   DG Femur Min 2 Views Left  Result Date: 08/24/2020 CLINICAL DATA:  Fall. EXAM: LEFT FEMUR 2 VIEWS COMPARISON:  No recent prior. FINDINGS: Degenerative changes left hip and knee. No acute bony or joint abnormality. No evidence of fracture or dislocation. Mild soft tissue ossification noted adjacent to the left hip most likely related old injury. Pelvic calcifications consistent phleboliths. IMPRESSION: Degenerative changes left hip and knee. No acute abnormality. Electronically Signed   By: Marcello Moores  Register   On: 08/24/2020 13:44    Procedures Procedures (including critical care time)  Medications Ordered in ED Medications - No data to display  ED Course  I have reviewed the triage vital signs and the nursing notes.  Pertinent labs & imaging results that were available during my care of the patient were  reviewed by me and considered in my medical decision making (see chart for details).    MDM Rules/Calculators/A&P                          MDM:  Xray foot, tibia and femur, no fracture.   Final Clinical Impression(s) / ED Diagnoses Final diagnoses:  Fall, initial encounter    Rx / DC Orders ED Discharge Orders    None    An After Visit Summary was printed and given to the patient.    Fransico Meadow, PA-C 08/24/20 [place], MD 08/27/20 [phone]

## 2020-09-06 ENCOUNTER — Emergency Department (HOSPITAL_COMMUNITY): Payer: Medicare Other

## 2020-09-06 ENCOUNTER — Inpatient Hospital Stay (HOSPITAL_COMMUNITY)
Admission: EM | Admit: 2020-09-06 | Discharge: 2020-09-09 | DRG: 369 | Disposition: A | Discharge status: Nursing Home (Includes ICF) | Payer: Medicare Other | Source: Skilled Nursing Facility | Attending: Family Medicine | Admitting: Family Medicine

## 2020-09-06 ENCOUNTER — Other Ambulatory Visit: Payer: Self-pay

## 2020-09-06 ENCOUNTER — Encounter (HOSPITAL_COMMUNITY): Payer: Self-pay | Admitting: Gastroenterology

## 2020-09-06 DIAGNOSIS — W19XXXA Unspecified fall, initial encounter: Secondary | ICD-10-CM

## 2020-09-06 DIAGNOSIS — Z20822 Contact with and (suspected) exposure to covid-19: Secondary | ICD-10-CM | POA: Diagnosis present

## 2020-09-06 DIAGNOSIS — Z888 Allergy status to other drugs, medicaments and biological substances status: Secondary | ICD-10-CM

## 2020-09-06 DIAGNOSIS — I69354 Hemiplegia and hemiparesis following cerebral infarction affecting left non-dominant side: Secondary | ICD-10-CM | POA: Diagnosis not present

## 2020-09-06 DIAGNOSIS — I110 Hypertensive heart disease with heart failure: Secondary | ICD-10-CM | POA: Diagnosis present

## 2020-09-06 DIAGNOSIS — F419 Anxiety disorder, unspecified: Secondary | ICD-10-CM | POA: Diagnosis present

## 2020-09-06 DIAGNOSIS — I5032 Chronic diastolic (congestive) heart failure: Secondary | ICD-10-CM

## 2020-09-06 DIAGNOSIS — F209 Schizophrenia, unspecified: Secondary | ICD-10-CM

## 2020-09-06 DIAGNOSIS — M79602 Pain in left arm: Secondary | ICD-10-CM | POA: Diagnosis present

## 2020-09-06 DIAGNOSIS — F411 Generalized anxiety disorder: Secondary | ICD-10-CM | POA: Diagnosis present

## 2020-09-06 DIAGNOSIS — I5030 Unspecified diastolic (congestive) heart failure: Secondary | ICD-10-CM | POA: Diagnosis present

## 2020-09-06 DIAGNOSIS — F17210 Nicotine dependence, cigarettes, uncomplicated: Secondary | ICD-10-CM | POA: Diagnosis present

## 2020-09-06 DIAGNOSIS — K5730 Diverticulosis of large intestine without perforation or abscess without bleeding: Secondary | ICD-10-CM | POA: Diagnosis present

## 2020-09-06 DIAGNOSIS — Z79899 Other long term (current) drug therapy: Secondary | ICD-10-CM

## 2020-09-06 DIAGNOSIS — M1990 Unspecified osteoarthritis, unspecified site: Secondary | ICD-10-CM | POA: Diagnosis present

## 2020-09-06 DIAGNOSIS — K449 Diaphragmatic hernia without obstruction or gangrene: Secondary | ICD-10-CM | POA: Diagnosis not present

## 2020-09-06 DIAGNOSIS — J449 Chronic obstructive pulmonary disease, unspecified: Secondary | ICD-10-CM | POA: Diagnosis present

## 2020-09-06 DIAGNOSIS — M79605 Pain in left leg: Secondary | ICD-10-CM | POA: Diagnosis present

## 2020-09-06 DIAGNOSIS — K644 Residual hemorrhoidal skin tags: Secondary | ICD-10-CM | POA: Diagnosis present

## 2020-09-06 DIAGNOSIS — D649 Anemia, unspecified: Secondary | ICD-10-CM | POA: Diagnosis not present

## 2020-09-06 DIAGNOSIS — I639 Cerebral infarction, unspecified: Secondary | ICD-10-CM | POA: Diagnosis present

## 2020-09-06 DIAGNOSIS — W050XXA Fall from non-moving wheelchair, initial encounter: Secondary | ICD-10-CM | POA: Diagnosis present

## 2020-09-06 DIAGNOSIS — R413 Other amnesia: Secondary | ICD-10-CM | POA: Diagnosis not present

## 2020-09-06 DIAGNOSIS — S069X9A Unspecified intracranial injury with loss of consciousness of unspecified duration, initial encounter: Secondary | ICD-10-CM | POA: Diagnosis present

## 2020-09-06 DIAGNOSIS — F319 Bipolar disorder, unspecified: Secondary | ICD-10-CM | POA: Diagnosis present

## 2020-09-06 DIAGNOSIS — E538 Deficiency of other specified B group vitamins: Secondary | ICD-10-CM | POA: Diagnosis present

## 2020-09-06 DIAGNOSIS — Z993 Dependence on wheelchair: Secondary | ICD-10-CM

## 2020-09-06 DIAGNOSIS — I10 Essential (primary) hypertension: Secondary | ICD-10-CM | POA: Diagnosis not present

## 2020-09-06 DIAGNOSIS — G839 Paralytic syndrome, unspecified: Secondary | ICD-10-CM

## 2020-09-06 DIAGNOSIS — E559 Vitamin D deficiency, unspecified: Secondary | ICD-10-CM | POA: Diagnosis present

## 2020-09-06 DIAGNOSIS — K2091 Esophagitis, unspecified with bleeding: Secondary | ICD-10-CM | POA: Diagnosis not present

## 2020-09-06 DIAGNOSIS — D500 Iron deficiency anemia secondary to blood loss (chronic): Secondary | ICD-10-CM | POA: Diagnosis present

## 2020-09-06 DIAGNOSIS — D509 Iron deficiency anemia, unspecified: Secondary | ICD-10-CM | POA: Diagnosis not present

## 2020-09-06 DIAGNOSIS — E876 Hypokalemia: Secondary | ICD-10-CM

## 2020-09-06 DIAGNOSIS — K5909 Other constipation: Secondary | ICD-10-CM | POA: Diagnosis present

## 2020-09-06 DIAGNOSIS — N3281 Overactive bladder: Secondary | ICD-10-CM | POA: Diagnosis present

## 2020-09-06 DIAGNOSIS — S069XAA Unspecified intracranial injury with loss of consciousness status unknown, initial encounter: Secondary | ICD-10-CM | POA: Diagnosis present

## 2020-09-06 DIAGNOSIS — F0390 Unspecified dementia without behavioral disturbance: Secondary | ICD-10-CM | POA: Diagnosis present

## 2020-09-06 DIAGNOSIS — Z87820 Personal history of traumatic brain injury: Secondary | ICD-10-CM

## 2020-09-06 DIAGNOSIS — Z9119 Patient's noncompliance with other medical treatment and regimen: Secondary | ICD-10-CM | POA: Diagnosis not present

## 2020-09-06 DIAGNOSIS — R296 Repeated falls: Secondary | ICD-10-CM | POA: Diagnosis present

## 2020-09-06 DIAGNOSIS — F79 Unspecified intellectual disabilities: Secondary | ICD-10-CM | POA: Diagnosis present

## 2020-09-06 LAB — COMPREHENSIVE METABOLIC PANEL
ALT: 20 U/L (ref 0–44)
AST: 32 U/L (ref 15–41)
Albumin: 3.6 g/dL (ref 3.5–5.0)
Alkaline Phosphatase: 47 U/L (ref 38–126)
Anion gap: 7 (ref 5–15)
BUN: 11 mg/dL (ref 6–20)
CO2: 27 mmol/L (ref 22–32)
Calcium: 9.5 mg/dL (ref 8.9–10.3)
Chloride: 109 mmol/L (ref 98–111)
Creatinine, Ser: 0.93 mg/dL (ref 0.44–1.00)
GFR, Estimated: >60 mL/min (ref >60)
Glucose, Bld: 108 mg/dL — ABNORMAL HIGH (ref 70–99)
Potassium: 2.9 mmol/L — ABNORMAL LOW (ref 3.5–5.1)
Sodium: 143 mmol/L (ref 135–145)
Total Bilirubin: 0.4 mg/dL (ref 0.3–1.2)
Total Protein: 6.8 g/dL (ref 6.5–8.1)

## 2020-09-06 LAB — CBC WITH DIFFERENTIAL/PLATELET
Abs Immature Granulocytes: 0.03 10*3/uL (ref 0.00–0.07)
Basophils Absolute: 0 10*3/uL (ref 0.0–0.1)
Basophils Relative: 1 %
Eosinophils Absolute: 0.2 10*3/uL (ref 0.0–0.5)
Eosinophils Relative: 3 %
HCT: 23.2 % — ABNORMAL LOW (ref 36.0–46.0)
Hemoglobin: 5.6 g/dL — CL (ref 12.0–15.0)
Immature Granulocytes: 1 %
Lymphocytes Relative: 27 %
Lymphs Abs: 1.5 10*3/uL (ref 0.7–4.0)
MCH: 16.2 pg — ABNORMAL LOW (ref 26.0–34.0)
MCHC: 24.1 g/dL — ABNORMAL LOW (ref 30.0–36.0)
MCV: 67.2 fL — ABNORMAL LOW (ref 80.0–100.0)
Monocytes Absolute: 0.5 10*3/uL (ref 0.1–1.0)
Monocytes Relative: 9 %
Neutro Abs: 3.3 10*3/uL (ref 1.7–7.7)
Neutrophils Relative %: 59 %
Platelets: 413 10*3/uL — ABNORMAL HIGH (ref 150–400)
RBC: 3.45 MIL/uL — ABNORMAL LOW (ref 3.87–5.11)
RDW: 21.8 % — ABNORMAL HIGH (ref 11.5–15.5)
WBC: 5.5 10*3/uL (ref 4.0–10.5)
nRBC: 0 % (ref 0.0–0.2)

## 2020-09-06 LAB — POC OCCULT BLOOD, ED: Fecal Occult Bld: POSITIVE — AB

## 2020-09-06 LAB — MAGNESIUM: Magnesium: 2 mg/dL (ref 1.7–2.4)

## 2020-09-06 LAB — IRON AND TIBC
Iron: 10 ug/dL — ABNORMAL LOW (ref 28–170)
Saturation Ratios: 2 % — ABNORMAL LOW (ref 10.4–31.8)
TIBC: 579 ug/dL — ABNORMAL HIGH (ref 250–450)
UIBC: 569 ug/dL

## 2020-09-06 LAB — PROTIME-INR
INR: 1 (ref 0.8–1.2)
Prothrombin Time: 12.9 seconds (ref 11.4–15.2)

## 2020-09-06 LAB — RETICULOCYTES
Immature Retic Fract: 23.4 % — ABNORMAL HIGH (ref 2.3–15.9)
RBC.: 3.53 MIL/uL — ABNORMAL LOW (ref 3.87–5.11)
Retic Count, Absolute: 70.6 10*3/uL (ref 19.0–186.0)
Retic Ct Pct: 2 % (ref 0.4–3.1)

## 2020-09-06 LAB — TROPONIN I (HIGH SENSITIVITY)
Troponin I (High Sensitivity): 8 ng/L (ref <18)
Troponin I (High Sensitivity): 9 ng/L (ref <18)

## 2020-09-06 LAB — PREPARE RBC (CROSSMATCH)

## 2020-09-06 LAB — ABO/RH: ABO/RH(D): AB POS

## 2020-09-06 LAB — FERRITIN: Ferritin: 13 ng/mL (ref 11–307)

## 2020-09-06 LAB — HIV ANTIBODY (ROUTINE TESTING W REFLEX): HIV Screen 4th Generation wRfx: NONREACTIVE

## 2020-09-06 LAB — VITAMIN D 25 HYDROXY (VIT D DEFICIENCY, FRACTURES): Vit D, 25-Hydroxy: 19.78 ng/mL — ABNORMAL LOW (ref 30–100)

## 2020-09-06 LAB — VITAMIN B12: Vitamin B-12: 102 pg/mL — ABNORMAL LOW (ref 180–914)

## 2020-09-06 LAB — FOLATE: Folate: 7.6 ng/mL (ref >5.9)

## 2020-09-06 MED ORDER — ACETAMINOPHEN 650 MG RE SUPP: 650.0000 mg | Freq: Four times a day (QID) | RECTAL | Status: DC | PRN

## 2020-09-06 MED ORDER — FUROSEMIDE 20 MG PO TABS
20.0000 mg | ORAL_TABLET | Freq: Every day | ORAL | Status: DC
  Administered 2020-09-07 – 2020-09-09 (×3): 20 mg via ORAL
  Filled 2020-09-06 (×3): qty 1

## 2020-09-06 MED ORDER — SODIUM CHLORIDE 0.9 % IV BOLUS
500.0000 mL | Freq: Once | INTRAVENOUS | Status: AC
  Administered 2020-09-06: 500 mL via INTRAVENOUS

## 2020-09-06 MED ORDER — RISPERIDONE 1 MG PO TABS
2.0000 mg | ORAL_TABLET | Freq: Two times a day (BID) | ORAL | Status: DC
  Administered 2020-09-07 – 2020-09-09 (×5): 2 mg via ORAL
  Filled 2020-09-06 (×5): qty 2

## 2020-09-06 MED ORDER — ONDANSETRON HCL 4 MG PO TABS: 4.0000 mg | ORAL_TABLET | Freq: Four times a day (QID) | ORAL | Status: DC | PRN

## 2020-09-06 MED ORDER — PANTOPRAZOLE SODIUM 40 MG IV SOLR
40.0000 mg | Freq: Two times a day (BID) | INTRAVENOUS | Status: DC
  Administered 2020-09-07: 40 mg via INTRAVENOUS
  Filled 2020-09-06 (×2): qty 40

## 2020-09-06 MED ORDER — PAROXETINE HCL 20 MG PO TABS
30.0000 mg | ORAL_TABLET | Freq: Every day | ORAL | Status: DC
  Administered 2020-09-07 – 2020-09-09 (×3): 30 mg via ORAL
  Filled 2020-09-06 (×3): qty 1

## 2020-09-06 MED ORDER — ONDANSETRON HCL 4 MG/2ML IJ SOLN
4.0000 mg | Freq: Four times a day (QID) | INTRAMUSCULAR | Status: DC | PRN
  Administered 2020-09-08: 4 mg via INTRAVENOUS
  Filled 2020-09-06: qty 2

## 2020-09-06 MED ORDER — CYCLOSPORINE 0.05 % OP EMUL
1.0000 [drp] | Freq: Every day | OPHTHALMIC | Status: DC
  Administered 2020-09-07 – 2020-09-09 (×3): 1 [drp] via OPHTHALMIC
  Filled 2020-09-06 (×3): qty 1

## 2020-09-06 MED ORDER — POLYVINYL ALCOHOL 1.4 % OP SOLN
1.0000 [drp] | Freq: Two times a day (BID) | OPHTHALMIC | Status: DC | PRN
  Filled 2020-09-06: qty 15

## 2020-09-06 MED ORDER — ACETAMINOPHEN 325 MG PO TABS: 650.0000 mg | ORAL_TABLET | Freq: Four times a day (QID) | ORAL | Status: DC | PRN

## 2020-09-06 MED ORDER — METOPROLOL TARTRATE 5 MG/5ML IV SOLN: 2.5000 mg | Freq: Four times a day (QID) | INTRAVENOUS | Status: DC | PRN

## 2020-09-06 MED ORDER — FLUTICASONE PROPIONATE 50 MCG/ACT NA SUSP
2.0000 | Freq: Every day | NASAL | Status: DC
  Administered 2020-09-07 – 2020-09-09 (×3): 2 via NASAL
  Filled 2020-09-06: qty 16

## 2020-09-06 MED ORDER — SODIUM CHLORIDE 0.9 % IV SOLN: 250.0000 mL | INTRAVENOUS | Status: DC | PRN

## 2020-09-06 MED ORDER — SODIUM CHLORIDE 0.9% FLUSH
3.0000 mL | Freq: Two times a day (BID) | INTRAVENOUS | Status: DC
  Administered 2020-09-06 – 2020-09-09 (×6): 3 mL via INTRAVENOUS

## 2020-09-06 MED ORDER — GABAPENTIN 100 MG PO CAPS
100.0000 mg | ORAL_CAPSULE | Freq: Three times a day (TID) | ORAL | Status: DC
  Administered 2020-09-07 – 2020-09-09 (×6): 100 mg via ORAL
  Filled 2020-09-06 (×7): qty 1

## 2020-09-06 MED ORDER — SODIUM CHLORIDE 0.9% FLUSH: 3.0000 mL | INTRAVENOUS | Status: DC | PRN

## 2020-09-06 MED ORDER — PANTOPRAZOLE SODIUM 40 MG IV SOLR
80.0000 mg | Freq: Once | INTRAVENOUS | Status: AC
  Administered 2020-09-06: 80 mg via INTRAVENOUS
  Filled 2020-09-06: qty 80

## 2020-09-06 MED ORDER — POTASSIUM CHLORIDE CRYS ER 20 MEQ PO TBCR
40.0000 meq | EXTENDED_RELEASE_TABLET | Freq: Once | ORAL | Status: AC
  Administered 2020-09-06: 40 meq via ORAL
  Filled 2020-09-06: qty 2

## 2020-09-06 MED ORDER — NICOTINE 14 MG/24HR TD PT24
14.0000 mg | MEDICATED_PATCH | Freq: Every day | TRANSDERMAL | Status: DC
  Administered 2020-09-06 – 2020-09-09 (×4): 14 mg via TRANSDERMAL
  Filled 2020-09-06 (×4): qty 1

## 2020-09-06 MED ORDER — LORAZEPAM 0.5 MG PO TABS
0.5000 mg | ORAL_TABLET | Freq: Two times a day (BID) | ORAL | Status: DC
  Administered 2020-09-07 – 2020-09-09 (×4): 0.5 mg via ORAL
  Filled 2020-09-06 (×5): qty 1

## 2020-09-06 MED ORDER — DICYCLOMINE HCL 20 MG PO TABS
20.0000 mg | ORAL_TABLET | Freq: Two times a day (BID) | ORAL | Status: DC
  Filled 2020-09-06 (×4): qty 1

## 2020-09-06 MED ORDER — OXYBUTYNIN CHLORIDE ER 5 MG PO TB24
5.0000 mg | ORAL_TABLET | Freq: Every day | ORAL | Status: DC
  Administered 2020-09-07 – 2020-09-09 (×3): 5 mg via ORAL
  Filled 2020-09-06 (×3): qty 1

## 2020-09-06 MED ORDER — SODIUM CHLORIDE 0.9 % IV SOLN
10.0000 mL/h | Freq: Once | INTRAVENOUS | Status: AC
  Administered 2020-09-06: 10 mL/h via INTRAVENOUS

## 2020-09-06 NOTE — ED Triage Notes (Signed)
Pt arrived from [facility] post call out of wheelchair.  Pt complaining of left arm and leg pain.  Pt has a hx of dementia
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
## 2020-09-06 NOTE — ED Notes (Signed)
Date and time results received: 09/06/20 1338  Test: Hemoglobin Critical Value: 5.6  Name of Provider Notified: Long  Orders Received? Or Actions Taken?: Awaiting orders
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
## 2020-09-07 ENCOUNTER — Inpatient Hospital Stay (HOSPITAL_COMMUNITY): Payer: Medicare Other | Admitting: Certified Registered"

## 2020-09-07 ENCOUNTER — Encounter (HOSPITAL_COMMUNITY): Payer: Self-pay | Admitting: Family Medicine

## 2020-09-07 ENCOUNTER — Encounter (HOSPITAL_COMMUNITY)
Admission: EM | Disposition: A | Discharge status: Nursing Home (Includes ICF) | Payer: Self-pay | Source: Skilled Nursing Facility | Attending: Family Medicine

## 2020-09-07 ENCOUNTER — Other Ambulatory Visit: Payer: Self-pay

## 2020-09-07 DIAGNOSIS — S069X9A Unspecified intracranial injury with loss of consciousness of unspecified duration, initial encounter: Secondary | ICD-10-CM | POA: Diagnosis present

## 2020-09-07 DIAGNOSIS — K2091 Esophagitis, unspecified with bleeding: Secondary | ICD-10-CM

## 2020-09-07 DIAGNOSIS — S069XAA Unspecified intracranial injury with loss of consciousness status unknown, initial encounter: Secondary | ICD-10-CM | POA: Diagnosis present

## 2020-09-07 DIAGNOSIS — E538 Deficiency of other specified B group vitamins: Secondary | ICD-10-CM | POA: Diagnosis present

## 2020-09-07 DIAGNOSIS — F411 Generalized anxiety disorder: Secondary | ICD-10-CM

## 2020-09-07 DIAGNOSIS — K449 Diaphragmatic hernia without obstruction or gangrene: Secondary | ICD-10-CM

## 2020-09-07 DIAGNOSIS — F79 Unspecified intellectual disabilities: Secondary | ICD-10-CM

## 2020-09-07 DIAGNOSIS — D509 Iron deficiency anemia, unspecified: Secondary | ICD-10-CM

## 2020-09-07 DIAGNOSIS — I10 Essential (primary) hypertension: Secondary | ICD-10-CM

## 2020-09-07 DIAGNOSIS — E559 Vitamin D deficiency, unspecified: Secondary | ICD-10-CM | POA: Diagnosis present

## 2020-09-07 HISTORY — PX: BIOPSY: SHX5522

## 2020-09-07 HISTORY — PX: ESOPHAGOGASTRODUODENOSCOPY (EGD) WITH PROPOFOL: SHX5813

## 2020-09-07 LAB — BPAM RBC
Blood Product Expiration Date: 202111212359
Blood Product Expiration Date: 202111212359
ISSUE DATE / TIME: 202111011646
ISSUE DATE / TIME: 202111012004
Unit Type and Rh: 6200
Unit Type and Rh: 6200

## 2020-09-07 LAB — TYPE AND SCREEN
ABO/RH(D): AB POS
Antibody Screen: NEGATIVE
Unit division: 0
Unit division: 0

## 2020-09-07 LAB — BASIC METABOLIC PANEL
Anion gap: 5 (ref 5–15)
BUN: 8 mg/dL (ref 6–20)
CO2: 24 mmol/L (ref 22–32)
Calcium: 9 mg/dL (ref 8.9–10.3)
Chloride: 112 mmol/L — ABNORMAL HIGH (ref 98–111)
Creatinine, Ser: 0.81 mg/dL (ref 0.44–1.00)
GFR, Estimated: >60 mL/min (ref >60)
Glucose, Bld: 95 mg/dL (ref 70–99)
Potassium: 3.4 mmol/L — ABNORMAL LOW (ref 3.5–5.1)
Sodium: 141 mmol/L (ref 135–145)

## 2020-09-07 LAB — CBC
HCT: 28.3 % — ABNORMAL LOW (ref 36.0–46.0)
Hemoglobin: 8 g/dL — ABNORMAL LOW (ref 12.0–15.0)
MCH: 19.8 pg — ABNORMAL LOW (ref 26.0–34.0)
MCHC: 28.3 g/dL — ABNORMAL LOW (ref 30.0–36.0)
MCV: 69.9 fL — ABNORMAL LOW (ref 80.0–100.0)
Platelets: 387 10*3/uL (ref 150–400)
RBC: 4.05 MIL/uL (ref 3.87–5.11)
RDW: 22.6 % — ABNORMAL HIGH (ref 11.5–15.5)
WBC: 5 10*3/uL (ref 4.0–10.5)
nRBC: 0.4 % — ABNORMAL HIGH (ref 0.0–0.2)

## 2020-09-07 LAB — RESPIRATORY PANEL BY RT PCR (FLU A&B, COVID)
Influenza A by PCR: NEGATIVE
Influenza B by PCR: NEGATIVE
SARS Coronavirus 2 by RT PCR: NEGATIVE

## 2020-09-07 LAB — MAGNESIUM: Magnesium: 1.8 mg/dL (ref 1.7–2.4)

## 2020-09-07 SURGERY — ESOPHAGOGASTRODUODENOSCOPY (EGD) WITH PROPOFOL
Anesthesia: General

## 2020-09-07 MED ORDER — LACTATED RINGERS IV SOLN: INTRAVENOUS | Status: DC | PRN

## 2020-09-07 MED ORDER — SODIUM CHLORIDE 0.9 % IV SOLN: INTRAVENOUS | Status: DC

## 2020-09-07 MED ORDER — PANTOPRAZOLE SODIUM 40 MG PO TBEC
40.0000 mg | DELAYED_RELEASE_TABLET | Freq: Two times a day (BID) | ORAL | Status: DC
  Administered 2020-09-07 – 2020-09-09 (×3): 40 mg via ORAL
  Filled 2020-09-07 (×3): qty 1

## 2020-09-07 MED ORDER — VITAMIN D (ERGOCALCIFEROL) 1.25 MG (50000 UNIT) PO CAPS
50000.0000 [IU] | ORAL_CAPSULE | ORAL | Status: DC
  Administered 2020-09-08: 50000 [IU] via ORAL

## 2020-09-07 MED ORDER — POLYETHYLENE GLYCOL 3350 17 G PO PACK
17.0000 g | PACK | Freq: Once | ORAL | Status: AC
  Administered 2020-09-08: 17 g via ORAL
  Filled 2020-09-07: qty 1

## 2020-09-07 MED ORDER — LIDOCAINE VISCOUS HCL 2 % MT SOLN
15.0000 mL | Freq: Once | OROMUCOSAL | Status: AC
  Administered 2020-09-07: 15 mL via OROMUCOSAL

## 2020-09-07 MED ORDER — GLYCOPYRROLATE 0.2 MG/ML IJ SOLN
INTRAMUSCULAR | Status: AC
  Filled 2020-09-07: qty 1

## 2020-09-07 MED ORDER — LIDOCAINE HCL (CARDIAC) PF 100 MG/5ML IV SOSY
PREFILLED_SYRINGE | INTRAVENOUS | Status: DC | PRN
  Administered 2020-09-07 (×2): 50 mg via INTRAVENOUS

## 2020-09-07 MED ORDER — POTASSIUM CHLORIDE CRYS ER 20 MEQ PO TBCR
40.0000 meq | EXTENDED_RELEASE_TABLET | Freq: Once | ORAL | Status: AC
  Administered 2020-09-07: 40 meq via ORAL
  Filled 2020-09-07: qty 2

## 2020-09-07 MED ORDER — PROPOFOL 500 MG/50ML IV EMUL
INTRAVENOUS | Status: DC | PRN
  Administered 2020-09-07: 100 ug/kg/min via INTRAVENOUS

## 2020-09-07 MED ORDER — LACTATED RINGERS IV SOLN: INTRAVENOUS | Status: DC

## 2020-09-07 MED ORDER — CYANOCOBALAMIN 1000 MCG/ML IJ SOLN: 1000.0000 ug | Freq: Once | INTRAMUSCULAR | Status: DC

## 2020-09-07 MED ORDER — LIDOCAINE VISCOUS HCL 2 % MT SOLN
OROMUCOSAL | Status: AC
  Filled 2020-09-07: qty 15

## 2020-09-07 MED ORDER — GLYCOPYRROLATE 0.2 MG/ML IJ SOLN
0.2000 mg | Freq: Once | INTRAMUSCULAR | Status: AC
  Administered 2020-09-07: 0.2 mg via INTRAVENOUS

## 2020-09-07 MED ORDER — PROPOFOL 10 MG/ML IV BOLUS
INTRAVENOUS | Status: DC | PRN
  Administered 2020-09-07: 30 mg via INTRAVENOUS
  Administered 2020-09-07: 20 mg via INTRAVENOUS
  Administered 2020-09-07: 80 mg via INTRAVENOUS

## 2020-09-07 MED ORDER — DICYCLOMINE HCL 10 MG PO CAPS
20.0000 mg | ORAL_CAPSULE | Freq: Two times a day (BID) | ORAL | Status: DC
  Administered 2020-09-07 – 2020-09-09 (×4): 20 mg via ORAL
  Filled 2020-09-07 (×4): qty 2

## 2020-09-07 MED ORDER — STERILE WATER FOR IRRIGATION IR SOLN
Status: DC | PRN
  Administered 2020-09-07: 1.5 mL

## 2020-09-07 NOTE — Transfer of Care (Signed)
Immediate Anesthesia Transfer of Care Note  Patient: Karen Craig  Procedure(s) Performed: ESOPHAGOGASTRODUODENOSCOPY (EGD) WITH PROPOFOL (N/A ) BIOPSY  Patient Location: PACU  Anesthesia Type:General  Level of Consciousness: awake, alert  and oriented  Airway & Oxygen Therapy: Patient Spontanous Breathing  Post-op Assessment: Report given to RN and Post -op Vital signs reviewed and stable  Post vital signs: Reviewed and stable  Last Vitals:  Vitals Value Taken Time  BP 121/97 09/07/20 1420  Temp    Pulse 96 09/07/20 1423  Resp 23 09/07/20 1423  SpO2 99 % 09/07/20 1423  Vitals shown include unvalidated device data.  Last Pain:  Vitals:   09/07/20 1355  TempSrc:   PainSc: 0-No pain         Complications: No complications documented.

## 2020-09-07 NOTE — Brief Op Note (Signed)
09/06/2020 - 09/07/2020  2:26 PM  PATIENT:  Karen Craig  54 y.o. female  PRE-OPERATIVE DIAGNOSIS:  anemia; heme positive stool, ?dysphagia  POST-OPERATIVE DIAGNOSIS:  esophagitis, hiatal hernia (gastric bulb @ 40cm and z-line @ 35cm)   PROCEDURE:  Procedure(s): ESOPHAGOGASTRODUODENOSCOPY (EGD) WITH PROPOFOL (N/A) BIOPSY  SURGEON:  Surgeon(s) and Role:    * Harvel Quale, MD - Primary  Who proceeded to perform EGD under propofol sedation today. Patient tolerated the procedure well. She was found to have grade C esophagitis with some spontaneous blood oozing that was self-limited, no intervention was performed in this area. There was also presence of a 5 cm hiatal hernia, no Cameron lesions were observed. Stomach and duodenum were within normal limits. Biopsies were taken from the small bowel to rule out celiac disease.  Recommendations: - Return patient to hospital ward for ongoing care.  - Clear liquid diet today, n.p.o. after midnight.  -Follow pathology results.  - Switch to omeprazole 40 mg PO BID for 3 months, then continue once a day indefinitely.  - Repeat upper endoscopy in 3 months for surveillance as the area of esophagitis could harbor Barrett's esophagus or malignancy.  - Schedule for colonoscopy tomorrow.  Maylon Peppers, MD Gastroenterology and Hepatology [facility]

## 2020-09-07 NOTE — Progress Notes (Signed)
Respiratory Panel swab completed and delivered to lab.
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
## 2020-09-07 NOTE — Anesthesia Postprocedure Evaluation (Signed)
Anesthesia Post Note  Patient: Karen Craig  Procedure(s) Performed: ESOPHAGOGASTRODUODENOSCOPY (EGD) WITH PROPOFOL (N/A ) BIOPSY  Patient location during evaluation: PACU Anesthesia Type: General Level of consciousness: awake and alert Pain management: pain level controlled Vital Signs Assessment: post-procedure vital signs reviewed and stable Respiratory status: nonlabored ventilation, spontaneous breathing and respiratory function stable Cardiovascular status: blood pressure returned to baseline and stable Postop Assessment: no apparent nausea or vomiting Anesthetic complications: no   No complications documented.   Last Vitals:  Vitals:   09/07/20 1420 09/07/20 1430  BP: (!) 121/97 (!) 163/96  Pulse: (!) 103 (!) 104  Resp: (!) 22 18  Temp: 36.9 C   SpO2: 98% 100%    Last Pain:  Vitals:   09/07/20 1420  TempSrc:   PainSc: 0-No pain                 Orlie Dakin

## 2020-09-07 NOTE — Op Note (Signed)
[facility] Patient Name: Karen Craig Procedure Date: 09/07/2020 1:42 PM MRN: [id number] Date of Birth: [birth year] Attending MD: Maylon Peppers ,  CSN: [id number] Age: 54 Admit Type: Outpatient Procedure:                Upper GI endoscopy Indications:              Iron deficiency anemia Providers:                Maylon Peppers, Jeanann Lewandowsky. Sharon Seller, RN, Dereck Leep, Technician, Aram Candela Referring MD:              Medicines:                Monitored Anesthesia Care Complications:            No immediate complications. Estimated Blood Loss:     Estimated blood loss: none. Procedure:                Pre-Anesthesia Assessment:                           - Prior to the procedure, a History and Physical                            was performed, and patient medications, allergies                            and sensitivities were reviewed. The patient's                            tolerance of previous anesthesia was reviewed.                           - The risks and benefits of the procedure and the                            sedation options and risks were discussed with the                            patient. All questions were answered and informed                            consent was obtained.                           - ASA Grade Assessment: III - A patient with severe                            systemic disease.                           After obtaining informed consent, the endoscope was                            passed under direct vision.  Throughout the                            procedure, the patient's blood pressure, pulse, and                            oxygen saturations were monitored continuously. The                            [id number]-H190 ([id number]) scope was introduced through the                            mouth, and advanced to the second part of duodenum.                            The upper GI endoscopy was accomplished  without                            difficulty. The patient tolerated the procedure                            well. Scope In: 2:01:52 PM Scope Out: 2:13:28 PM Total Procedure Duration: 0 hours 11 minutes 36 seconds  Findings:      LA Grade C (one or more mucosal breaks continuous between tops of 2 or       more mucosal folds, less than 75% circumference) esophagitis with       spontaneous self limited bleeding was found 31 cm from the incisors.      A 5 cm hiatal hernia was present.      The entire examined stomach was normal.      The examined duodenum was normal. Biopsies were taken with a cold       forceps for histology. Impression:               - LA Grade C esophagitis with bleeding.                           - 5 cm hiatal hernia.                           - Normal stomach.                           - Normal examined duodenum. Biopsied. Moderate Sedation:      Per Anesthesia Care Recommendation:           - Return patient to hospital ward for ongoing care.                           - Clear liquid diet today.                           - Await pathology results.                           - Use Prilosec (omeprazole) 40 mg PO BID for 3  months, then continue once a day indefinitely.                           - Repeat upper endoscopy in 3 months for                            surveillance.                           - Schedule for colonoscopy tomorrow. Procedure Code(s):        --- Professional ---                           [phone], GC, Esophagogastroduodenoscopy, flexible,                            transoral; with biopsy, single or multiple Diagnosis Code(s):        --- Professional ---                           K20.91, Esophagitis, unspecified with bleeding                           K44.9, Diaphragmatic hernia without obstruction or                            gangrene                           D50.9, Iron deficiency anemia, unspecified CPT copyright 2019  American Medical Association. All rights reserved. The codes documented in this report are preliminary and upon coder review may  be revised to meet current compliance requirements. Maylon Peppers, MD Maylon Peppers,  09/07/2020 2:26:11 PM This report has been signed electronically. Number of Addenda: 0

## 2020-09-07 NOTE — Progress Notes (Signed)
Patient presented to the emergency room by EMS after reported falls while at [facility] rest home. She was found to have profound anemia with hemoglobin 5.6, Hemoccult positive. She has denied overt GI bleeding. There was some question of dysphagia although history is limited. Had also reported an episode of N/V prior to falling out of wheelchair but not further episodes of nausea or vomiting. She was arranged to receive 2 units PRBCs yesterday, started on PPI IV twice daily, with plans for EGD today pending stability/laboratory improvement.  Hemoglobin improved to 8.0 today. Potassium improved to 3.4. Magnesium within normal limits. No overt GI bleeding over night.   I have discussed case with Dr. Jenetta Downer. We will plan for EGD +/- dilation with propofol today.

## 2020-09-08 ENCOUNTER — Encounter (HOSPITAL_COMMUNITY): Payer: Self-pay | Admitting: Family Medicine

## 2020-09-08 ENCOUNTER — Encounter (HOSPITAL_COMMUNITY)
Admission: EM | Disposition: A | Discharge status: Nursing Home (Includes ICF) | Payer: Self-pay | Source: Skilled Nursing Facility | Attending: Family Medicine

## 2020-09-08 ENCOUNTER — Inpatient Hospital Stay (HOSPITAL_COMMUNITY): Payer: Medicare Other | Admitting: Anesthesiology

## 2020-09-08 DIAGNOSIS — K5730 Diverticulosis of large intestine without perforation or abscess without bleeding: Secondary | ICD-10-CM

## 2020-09-08 DIAGNOSIS — Z9119 Patient's noncompliance with other medical treatment and regimen: Secondary | ICD-10-CM

## 2020-09-08 DIAGNOSIS — K644 Residual hemorrhoidal skin tags: Secondary | ICD-10-CM

## 2020-09-08 DIAGNOSIS — D500 Iron deficiency anemia secondary to blood loss (chronic): Secondary | ICD-10-CM

## 2020-09-08 HISTORY — PX: COLONOSCOPY WITH PROPOFOL: SHX5780

## 2020-09-08 LAB — BASIC METABOLIC PANEL
Anion gap: 9 (ref 5–15)
BUN: 8 mg/dL (ref 6–20)
CO2: 22 mmol/L (ref 22–32)
Calcium: 9.1 mg/dL (ref 8.9–10.3)
Chloride: 109 mmol/L (ref 98–111)
Creatinine, Ser: 0.8 mg/dL (ref 0.44–1.00)
GFR, Estimated: >60 mL/min (ref >60)
Glucose, Bld: 83 mg/dL (ref 70–99)
Potassium: 3.4 mmol/L — ABNORMAL LOW (ref 3.5–5.1)
Sodium: 140 mmol/L (ref 135–145)

## 2020-09-08 LAB — CBC
HCT: 29 % — ABNORMAL LOW (ref 36.0–46.0)
Hemoglobin: 8.1 g/dL — ABNORMAL LOW (ref 12.0–15.0)
MCH: 20 pg — ABNORMAL LOW (ref 26.0–34.0)
MCHC: 27.9 g/dL — ABNORMAL LOW (ref 30.0–36.0)
MCV: 71.4 fL — ABNORMAL LOW (ref 80.0–100.0)
Platelets: 359 10*3/uL (ref 150–400)
RBC: 4.06 MIL/uL (ref 3.87–5.11)
RDW: 23.9 % — ABNORMAL HIGH (ref 11.5–15.5)
WBC: 5.8 10*3/uL (ref 4.0–10.5)
nRBC: 0 % (ref 0.0–0.2)

## 2020-09-08 LAB — SARS CORONAVIRUS 2 (TAT 6-24 HRS): SARS Coronavirus 2: NEGATIVE

## 2020-09-08 LAB — MAGNESIUM: Magnesium: 1.9 mg/dL (ref 1.7–2.4)

## 2020-09-08 SURGERY — COLONOSCOPY WITH PROPOFOL
Anesthesia: General

## 2020-09-08 MED ORDER — STERILE WATER FOR IRRIGATION IR SOLN
Status: DC | PRN
  Administered 2020-09-08: 1.5 mL

## 2020-09-08 MED ORDER — PROPOFOL 500 MG/50ML IV EMUL
INTRAVENOUS | Status: DC | PRN
  Administered 2020-09-08: 150 ug/kg/min via INTRAVENOUS

## 2020-09-08 MED ORDER — LACTATED RINGERS IV SOLN
INTRAVENOUS | Status: DC
  Administered 2020-09-08: 1000 mL via INTRAVENOUS

## 2020-09-08 MED ORDER — PROPOFOL 10 MG/ML IV BOLUS
INTRAVENOUS | Status: DC | PRN
  Administered 2020-09-08: 20 mg via INTRAVENOUS
  Administered 2020-09-08: 50 mg via INTRAVENOUS
  Administered 2020-09-08: 20 mg via INTRAVENOUS

## 2020-09-08 MED ORDER — POTASSIUM CHLORIDE CRYS ER 20 MEQ PO TBCR
40.0000 meq | EXTENDED_RELEASE_TABLET | ORAL | Status: AC
  Administered 2020-09-08 (×2): 40 meq via ORAL
  Filled 2020-09-08: qty 2

## 2020-09-08 NOTE — Transfer of Care (Signed)
Immediate Anesthesia Transfer of Care Note  Patient: Karen Craig  Procedure(s) Performed: COLONOSCOPY WITH PROPOFOL (N/A )  Patient Location: PACU  Anesthesia Type:General  Level of Consciousness: awake  Airway & Oxygen Therapy: Patient Spontanous Breathing  Post-op Assessment: Report given to RN  Post vital signs: Reviewed  Last Vitals:  Vitals Value Taken Time  BP 143/86 09/08/20 1527  Temp    Pulse 75 09/08/20 1528  Resp 18 09/08/20 1528  SpO2 100 % 09/08/20 1528  Vitals shown include unvalidated device data.  Last Pain:  Vitals:   09/08/20 1456  TempSrc:   PainSc: 0-No pain      Patients Stated Pain Goal: 6 (94/70/76 1518)  Complications: No complications documented.

## 2020-09-08 NOTE — Op Note (Signed)
[facility] Patient Name: Karen Craig Procedure Date: 09/08/2020 2:37 PM MRN: [id number] Date of Birth: [birth year] Attending MD: Hildred Laser , MD CSN: [id number] Age: 54 Admit Type: Inpatient Procedure:                Colonoscopy Indications:              Iron deficiency anemia secondary to chronic blood                            loss Providers:                Hildred Laser, MD, Caprice Kluver, Kristine L. Risa Grill, Technician, Aram Candela Referring MD:             Irwin Brakeman, MD Medicines:                Propofol per Anesthesia Complications:            No immediate complications. Estimated Blood Loss:     Estimated blood loss: none. Procedure:                Pre-Anesthesia Assessment:                           - Prior to the procedure, a History and Physical                            was performed, and patient medications and                            allergies were reviewed. The patient's tolerance of                            previous anesthesia was also reviewed. The risks                            and benefits of the procedure and the sedation                            options and risks were discussed with the patient.                            All questions were answered, and informed consent                            was obtained. Prior Anticoagulants: The patient has                            taken no previous anticoagulant or antiplatelet                            agents except for aspirin. ASA Grade Assessment:  III - A patient with severe systemic disease. After                            reviewing the risks and benefits, the patient was                            deemed in satisfactory condition to undergo the                            procedure.                           After obtaining informed consent, the colonoscope                            was passed under direct vision. Throughout  the                            procedure, the patient's blood pressure, pulse, and                            oxygen saturations were monitored continuously. The                            [id number]-[id number] ([id number]) scope was introduced through                            the anus and advanced to the the cecum. Cecal                            landmarks could not be seen because of presence of                            formed stool. Therefore position was confirmed by                            using scope guide. The colonoscopy was performed                            without difficulty. The patient tolerated the                            procedure well. The quality of the bowel                            preparation was good except the cecum was poor. The                            rectum was photographed. Scope In: 3:04:32 PM Scope Out: 3:20:28 PM Scope Withdrawal Time: 0 hours 2 minutes 43 seconds  Total Procedure Duration: 0 hours 15 minutes 56 seconds  Findings:      Skin tags were found on perianal exam.      Scattered diverticula were found in the sigmoid colon.  External hemorrhoids were found during retroflexion. The hemorrhoids       were small. Impression:               - Perianal skin tags found on perianal exam.                           - Diverticulosis in the sigmoid colon.                           - External hemorrhoids.                           - No specimens collected.                           Comment: Examination of cecum not performed because                            of presence of large amount of formed stool.                           Cecum was well seen when she had her last exam by                            Dr. Oretha Caprice in July 26, 2018. Moderate Sedation:      Per Anesthesia Care Recommendation:           - Return patient to hospital ward for ongoing care.                           - Low sodium diet today.                           - Continue  present medications.                           - No recommendation at this time regarding repeat                            colonoscopy at this time. Procedure Code(s):        --- Professional ---                           [id number], Colonoscopy, flexible; diagnostic, including                            collection of specimen(s) by brushing or washing,                            when performed (separate procedure) Diagnosis Code(s):        --- Professional ---                           K64.4, Residual hemorrhoidal skin tags  D50.0, Iron deficiency anemia secondary to blood                            loss (chronic)                           K57.30, Diverticulosis of large intestine without                            perforation or abscess without bleeding CPT copyright 2019 American Medical Association. All rights reserved. The codes documented in this report are preliminary and upon coder review may  be revised to meet current compliance requirements. Hildred Laser, MD Hildred Laser, MD 09/08/2020 3:41:52 PM This report has been signed electronically. Number of Addenda: 0

## 2020-09-08 NOTE — Anesthesia Postprocedure Evaluation (Signed)
Anesthesia Post Note  Patient: Karen Craig  Procedure(s) Performed: COLONOSCOPY WITH PROPOFOL (N/A )  Patient location during evaluation: PACU Anesthesia Type: General Level of consciousness: awake and alert Pain management: pain level controlled Vital Signs Assessment: post-procedure vital signs reviewed and stable Respiratory status: spontaneous breathing Cardiovascular status: blood pressure returned to baseline and stable Postop Assessment: no apparent nausea or vomiting Anesthetic complications: no   No complications documented.   Last Vitals:  Vitals:   09/08/20 1528 09/08/20 1530  BP: (!) (P) 143/86 (!) 143/86  Pulse: 78 74  Resp: 20 (!) 24  Temp: (P) 36.9 C   SpO2: 100% 99%    Last Pain:  Vitals:   09/08/20 1456  TempSrc:   PainSc: 0-No pain                 Gerod Caligiuri
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
## 2020-09-08 NOTE — Progress Notes (Signed)
Patient returned from colonoscopy. Patient denies pain at this time. Patient given water per pt request. Vital signs obtain.  Elevated BP, lasix given. Patient resting comfortably this time. Will monitor.
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
## 2020-09-08 NOTE — Progress Notes (Signed)
Brief colonoscopy note.  Examination performed to cecum but landmarks not seen because of solid stool. Position confirmed the scope diet. She had formed stool in the rectum rest of the colon was adequate for evaluation. Scattered diverticuli at sigmoid colon but no evidence of polyps or other abnormalities. Small external hemorrhoids.

## 2020-09-08 NOTE — Progress Notes (Signed)
Subjective:  Patient complains of chest pain over left rib cage.  She states when she fell it was a hard fall.  She fell from wheelchair.  She denies cough or hemoptysis.  She says she is not short of breath at the present time.  She is hungry.  She does complain of passing small amount of bright red blood per rectum.  She denies abdominal pain.  Current Medications:  Current Facility-Administered Medications:  .  [MAR Hold] 0.9 %  sodium chloride infusion, 250 mL, Intravenous, PRN, Johnson, Clanford L, MD .  0.9 %  sodium chloride infusion, , Intravenous, Continuous, Montez Morita, Daniel, MD, Last Rate: 20 mL/hr at 09/08/20 0358, New Bag at 09/08/20 0358 .  [MAR Hold] acetaminophen (TYLENOL) tablet 650 mg, 650 mg, Oral, Q6H PRN **OR** [MAR Hold] acetaminophen (TYLENOL) suppository 650 mg, 650 mg, Rectal, Q6H PRN, Johnson, Clanford L, MD .  Doug Sou Hold] cyanocobalamin ((VITAMIN B-12)) injection 1,000 mcg, 1,000 mcg, Intramuscular, Once, Johnson, Clanford L, MD .  Doug Sou Hold] cycloSPORINE (RESTASIS) 0.05 % ophthalmic emulsion 1 drop, 1 drop, Both Eyes, Daily, Johnson, Clanford L, MD, 1 drop at 09/08/20 1143 .  [MAR Hold] dicyclomine (BENTYL) capsule 20 mg, 20 mg, Oral, BID, Johnson, Clanford L, MD, 20 mg at 09/07/20 2201 .  [MAR Hold] fluticasone (FLONASE) 50 MCG/ACT nasal spray 2 spray, 2 spray, Each Nare, Daily, Johnson, Clanford L, MD, 2 spray at 09/08/20 0925 .  [MAR Hold] furosemide (LASIX) tablet 20 mg, 20 mg, Oral, Daily, Johnson, Clanford L, MD, 20 mg at 09/07/20 0957 .  [MAR Hold] gabapentin (NEURONTIN) capsule 100 mg, 100 mg, Oral, TID, Johnson, Clanford L, MD, 100 mg at 09/07/20 2201 .  lactated ringers infusion, , Intravenous, Continuous, Kiel, Coralie Keens, MD, Last Rate: 10 mL/hr at 09/08/20 1324, 1,000 mL at 09/08/20 1324 .  [MAR Hold] LORazepam (ATIVAN) tablet 0.5 mg, 0.5 mg, Oral, BID, Johnson, Clanford L, MD, 0.5 mg at 09/07/20 2201 .  [MAR Hold] metoprolol tartrate (LOPRESSOR)  injection 2.5 mg, 2.5 mg, Intravenous, Q6H PRN, Johnson, Clanford L, MD .  Doug Sou Hold] nicotine (NICODERM CQ - dosed in mg/24 hours) patch 14 mg, 14 mg, Transdermal, Daily, Johnson, Clanford L, MD, 14 mg at 09/08/20 0926 .  [MAR Hold] ondansetron (ZOFRAN) tablet 4 mg, 4 mg, Oral, Q6H PRN **OR** [MAR Hold] ondansetron (ZOFRAN) injection 4 mg, 4 mg, Intravenous, Q6H PRN, Johnson, Clanford L, MD .  Doug Sou Hold] oxybutynin (DITROPAN-XL) 24 hr tablet 5 mg, 5 mg, Oral, Daily, Johnson, Clanford L, MD, 5 mg at 09/07/20 0958 .  [MAR Hold] pantoprazole (PROTONIX) EC tablet 40 mg, 40 mg, Oral, BID, Montez Morita, Quillian Quince, MD, 40 mg at 09/07/20 2201 .  [MAR Hold] PARoxetine (PAXIL) tablet 30 mg, 30 mg, Oral, Daily, Johnson, Clanford L, MD, 30 mg at 09/07/20 0958 .  [MAR Hold] polyvinyl alcohol (LIQUIFILM TEARS) 1.4 % ophthalmic solution 1 drop, 1 drop, Both Eyes, BID PRN, Johnson, Clanford L, MD .  potassium chloride SA (KLOR-CON) CR tablet 40 mEq, 40 mEq, Oral, Q3H, Emokpae, Courage, MD .  Doug Sou Hold] risperiDONE (RISPERDAL) tablet 2 mg, 2 mg, Oral, BID, Johnson, Clanford L, MD, 2 mg at 09/07/20 2201 .  [MAR Hold] sodium chloride flush (NS) 0.9 % injection 3 mL, 3 mL, Intravenous, Q12H, Johnson, Clanford L, MD, 3 mL at 09/07/20 2201 .  [MAR Hold] sodium chloride flush (NS) 0.9 % injection 3 mL, 3 mL, Intravenous, PRN, Wynetta Emery, Eldridge Dace, MD .  Doug Sou Hold] Vitamin D (Ergocalciferol) (  DRISDOL) capsule 50,000 Units, 50,000 Units, Oral, Q7 days, Johnson, Clanford L, MD   Objective: Blood pressure (!) 153/77, pulse (!) 56, temperature 98.5 F (36.9 C), temperature source Oral, resp. rate 20, height $RemoveBe'5\' 6"'[id number]$  (1.676 m), weight 78 kg, SpO2 97 %. Patient is alert. Her speech is difficult to comprehend. She is pale. Cardiac exam with regular rhythm normal S1 and S2.  No murmur gallop noted. Auscultation lungs reveal vesicular breath sounds bilaterally. He is tender over the left costal margin anteriorly and laterally.   No bruise noted in this area. Abdomen is symmetrical and soft.  No tenderness organomegaly or masses. No pitting edema noted.  Labs/studies Results:  CBC Latest Ref Rng & Units 09/08/2020 09/07/2020 09/06/2020  WBC 4.0 - 10.5 K/uL 5.8 5.0 5.5  Hemoglobin 12.0 - 15.0 g/dL 8.1(L) 8.0(L) 5.6(LL)  Hematocrit 36 - 46 % 29.0(L) 28.3(L) 23.2(L)  Platelets 150 - 400 K/uL 359 387 413(H)    CMP Latest Ref Rng & Units 09/08/2020 09/07/2020 09/06/2020  Glucose 70 - 99 mg/dL 83 95 108(H)  BUN 6 - 20 mg/dL $Remove'8 8 11  'MUuiTZq$ Creatinine 0.44 - 1.00 mg/dL 0.80 0.81 0.93  Sodium 135 - 145 mmol/L 140 141 143  Potassium 3.5 - 5.1 mmol/L 3.4(L) 3.4(L) 2.9(L)  Chloride 98 - 111 mmol/L 109 112(H) 109  CO2 22 - 32 mmol/L $RemoveB'22 24 27  'clTBCRGZ$ Calcium 8.9 - 10.3 mg/dL 9.1 9.0 9.5  Total Protein 6.5 - 8.1 g/dL - - 6.8  Total Bilirubin 0.3 - 1.2 mg/dL - - 0.4  Alkaline Phos 38 - 126 U/L - - 47  AST 15 - 41 U/L - - 32  ALT 0 - 44 U/L - - 20    Hepatic Function Latest Ref Rng & Units 09/06/2020 06/27/2018 06/27/2018  Total Protein 6.5 - 8.1 g/dL 6.8 6.6 6.7  Albumin 3.5 - 5.0 g/dL 3.6 3.5 4.1  AST 15 - 41 U/L 32 14(L) 13  ALT 0 - 44 U/L $Remo'20 15 12  'wuTWX$ Alk Phosphatase 38 - 126 U/L 47 44 52  Total Bilirubin 0.3 - 1.2 mg/dL 0.4 0.3 0.2  Bilirubin, Direct 0.00 - 0.40 mg/dL - - 0.07    Chest film negative for rib fractures.  Duodenal biopsy pending.  Assessment:  #1.  Iron deficiency anemia.  Patient has received 3 units of PRBCs.  Her hemoglobin has come up from 5.6 g to 8.1 g.  She underwent EGD yesterday which revealed grade C reflux esophagitis which could be one source of GI blood loss.  She had colonoscopy by Dr. Ardis Hughs in [place] in September 2019 and noted to have external hemorrhoids but no other abnormalities noted.    #2.  History of CVA.  #3.  History of schizophrenia and anxiety disorder.  #4.  Severe COPD.  Patient appears to be stable from respiratory standpoint.   Recommendations  Proceed with diagnostic  colonoscopy. Patient is agreeable.

## 2020-09-09 ENCOUNTER — Encounter (HOSPITAL_COMMUNITY): Payer: Self-pay | Admitting: Internal Medicine

## 2020-09-09 ENCOUNTER — Telehealth: Payer: Self-pay | Admitting: Gastroenterology

## 2020-09-09 DIAGNOSIS — K2100 Gastro-esophageal reflux disease with esophagitis, without bleeding: Secondary | ICD-10-CM

## 2020-09-09 DIAGNOSIS — D509 Iron deficiency anemia, unspecified: Secondary | ICD-10-CM

## 2020-09-09 LAB — BASIC METABOLIC PANEL
Anion gap: 9 (ref 5–15)
BUN: 9 mg/dL (ref 6–20)
CO2: 22 mmol/L (ref 22–32)
Calcium: 9.2 mg/dL (ref 8.9–10.3)
Chloride: 107 mmol/L (ref 98–111)
Creatinine, Ser: 0.87 mg/dL (ref 0.44–1.00)
GFR, Estimated: >60 mL/min (ref >60)
Glucose, Bld: 88 mg/dL (ref 70–99)
Potassium: 3.5 mmol/L (ref 3.5–5.1)
Sodium: 138 mmol/L (ref 135–145)

## 2020-09-09 LAB — CBC
HCT: 29.4 % — ABNORMAL LOW (ref 36.0–46.0)
Hemoglobin: 7.8 g/dL — ABNORMAL LOW (ref 12.0–15.0)
MCH: 19 pg — ABNORMAL LOW (ref 26.0–34.0)
MCHC: 26.5 g/dL — ABNORMAL LOW (ref 30.0–36.0)
MCV: 71.5 fL — ABNORMAL LOW (ref 80.0–100.0)
Platelets: 354 10*3/uL (ref 150–400)
RBC: 4.11 MIL/uL (ref 3.87–5.11)
RDW: 24.8 % — ABNORMAL HIGH (ref 11.5–15.5)
WBC: 4.8 10*3/uL (ref 4.0–10.5)
nRBC: 0 % (ref 0.0–0.2)

## 2020-09-09 LAB — SURGICAL PATHOLOGY

## 2020-09-09 LAB — MAGNESIUM: Magnesium: 1.9 mg/dL (ref 1.7–2.4)

## 2020-09-09 MED ORDER — SENNOSIDES-DOCUSATE SODIUM 8.6-50 MG PO TABS
2.0000 | ORAL_TABLET | Freq: Every day | ORAL | 3 refills | Status: AC
Start: 2020-09-09 — End: 2020-11-08

## 2020-09-09 MED ORDER — VITAMIN D (ERGOCALCIFEROL) 1.25 MG (50000 UNIT) PO CAPS
50000.0000 [IU] | ORAL_CAPSULE | ORAL | 3 refills | Status: DC
Start: 2020-09-15 — End: 2024-01-31

## 2020-09-09 MED ORDER — PANTOPRAZOLE SODIUM 40 MG PO TBEC
40.0000 mg | DELAYED_RELEASE_TABLET | Freq: Two times a day (BID) | ORAL | 2 refills | Status: DC
Start: 2020-09-09 — End: 2024-08-04

## 2020-09-09 MED ORDER — CYANOCOBALAMIN 1000 MCG/ML IJ SOLN
1000.0000 ug | INTRAMUSCULAR | 5 refills | Status: DC
Start: 2020-09-09 — End: 2023-05-24

## 2020-09-09 MED ORDER — NICOTINE 14 MG/24HR TD PT24
14.0000 mg | MEDICATED_PATCH | Freq: Every day | TRANSDERMAL | 0 refills | Status: DC
Start: 2020-09-10 — End: 2023-05-24

## 2020-09-09 MED ORDER — ASPIRIN EC 81 MG PO TBEC: 81.0000 mg | DELAYED_RELEASE_TABLET | Freq: Every day | ORAL | 2 refills | Status: AC

## 2020-09-09 MED ORDER — ONDANSETRON HCL 4 MG PO TABS: 4.0000 mg | ORAL_TABLET | Freq: Four times a day (QID) | ORAL | 0 refills | Status: DC | PRN

## 2020-09-09 MED ORDER — LORAZEPAM 0.5 MG PO TABS: 0.5000 mg | ORAL_TABLET | Freq: Two times a day (BID) | ORAL | 0 refills | Status: AC

## 2020-09-09 NOTE — Discharge Summary (Signed)
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  Performed at [facility], [place] [address], [place], [place] [address]    Today   Subjective    Nancey Kreitz today has no new complaints --Eating and  drinking well No fever  Or chills   No Nausea, Vomiting or Diarrhea  Patient has been seen and examined prior to discharge   Objective   Blood pressure 130/69, pulse 74, temperature 98 F (36.7 C), resp. rate 16, height 5\' 6"  (1.676 m), weight 78 kg, SpO2 100 %.   Intake/Output Summary (Last 24 hours) at 09/09/2020 1437 Last data filed at 09/09/2020 0700  Gross per 24 hour  Intake 1033.17 ml  Output 300 ml    Net 733.17 ml   Exam General exam: female awake, alert, with intellectual disability, Appears calm and comfortable  Respiratory system: Clear to auscultation. Respiratory effort normal. Cardiovascular system: normal S1 & S2 heard. No JVD, murmurs, rubs, gallops or clicks.  Gastrointestinal system: Abdomen is nondistended, soft and nontender.  Normal bowel sounds heard. Central nervous system: Alert and oriented. No focal neurological deficits. Extremities: Symmetric 5 x 5 power. Skin: No rashes, lesions or ulcers  Psychiatry: Baseline cognitive deficits due to TBI   Data Review   CBC w Diff:  Lab Results  Component Value Date   WBC 4.8 09/09/2020   HGB 7.8 (L) 09/09/2020   HCT 29.4 (L) 09/09/2020   PLT 354 09/09/2020   LYMPHOPCT 27 09/06/2020   MONOPCT 9 09/06/2020   EOSPCT 3 09/06/2020   BASOPCT 1 09/06/2020    CMP:  Lab Results  Component Value Date   NA 138 09/09/2020   K 3.5 09/09/2020   CL 107 09/09/2020   CO2 22 09/09/2020   BUN 9 09/09/2020   CREATININE 0.87 09/09/2020   PROT 6.8 09/06/2020   PROT 6.7 06/27/2018   ALBUMIN 3.6 09/06/2020   ALBUMIN 4.1 06/27/2018   BILITOT 0.4 09/06/2020   BILITOT 0.2 06/27/2018   ALKPHOS 47 09/06/2020   AST 32 09/06/2020   ALT 20 09/06/2020   Total Discharge time is about 33 minutes  Roxan Hockey M.D on 09/09/2020 at 2:37 PM  Go to [email or web address] -  for contact info  [facility] - Office  [phone]

## 2020-09-09 NOTE — Plan of Care (Signed)

## 2020-09-09 NOTE — Discharge Instructions (Signed)
1)Monthly Vitamin B12 injection advised 2)Please Repeat CBC and BMP blood test weekly (every Monday)  for 3 weeks starting on 09/13/2020 3)ok to Use  seat belt on pt's wheelchair for safety reasons while at assisted living facility due to ambulatory dysfunction/generalized weakness and risk of falls

## 2020-09-09 NOTE — Telephone Encounter (Signed)
Please arrange for hospital follow up with [facility] for IDA and grade C reflux esophagitis (consider repeat EGD in 3 months for surveillance as the area of esophagitis could harbor Barrett's or malignancy).  We saw patient at [facility] during hospitalization but previously followed by Dr. Ardis Hughs.

## 2020-09-09 NOTE — TOC Transition Note (Signed)
Transition of Care [facility]) - CM/SW Discharge Note   Patient Details  Name: Karen Craig MRN: [id number] Date of Birth: 01-11-1966  Transition of Care [facility]) CM/SW Contact:  Shade Flood, LCSW Phone Number: 09/09/2020, 2:51 PM   Clinical Narrative:     Pt stable for her dc back to [place] per MD. Mitzi Davenport at [facility]. Updated pt's guardian, Nena Polio, at [facility]. Almyra Free is in agreement with plan for dc. Will fax dc summary to [place] at [phone].   EMS transport form printed to the floor. RN aware. Pt will transport with EMS.  There are no other TOC needs for dc.  Final next level of care: Assisted Living Barriers to Discharge: Barriers Resolved   Patient Goals and CMS Choice        Discharge Placement                       Discharge Plan and Services                          HH Arranged: RN, PT [facility] Agency: [place] Date [place]: 09/09/20   Representative spoke with at [place]: [place] Determinants of Health ([place]) Interventions     Readmission Risk Interventions No flowsheet data found.
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
## 2020-09-09 NOTE — Care Management Important Message (Signed)
Important Message  Patient Details  Name: Karen Craig MRN: [id number] Date of Birth: Sep 25, 1966   Medicare Important Message Given:  Yes     Tommy Medal 09/09/2020, 12:10 PM

## 2020-09-09 NOTE — NC FL2 (Signed)
[place] MEDICAID FL2 LEVEL OF CARE SCREENING TOOL     IDENTIFICATION  Patient Name: Karen Craig Birthdate: [birth year] Sex: female Admission Date (Current Location): 09/06/2020  [facility] and [place] Number:  [employer] and Address:  [place] [address] [place]      Provider Number: [id number]  Attending Physician Name and Address:  Roxan Hockey, MD  Relative Name and Phone Number:       Current Level of Care: Hospital Recommended Level of Care: [place] Prior Approval Number:    Date Approved/Denied:   PASRR Number:    Discharge Plan: Other (Comment) [facility])    Current Diagnoses: Patient Active Problem List   Diagnosis Date Noted  . Vitamin D deficiency 09/07/2020  . Vitamin B12 deficiency 09/07/2020  . TBI (traumatic brain injury) ([place]) 09/07/2020  . Intellectual disability 09/07/2020  . Symptomatic anemia 09/06/2020  . Schizophrenia ([place])   . 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[place])   . OAB (overactive bladder)   . HTN (hypertension)   . COPD (chronic obstructive pulmonary disease) ([place])   . Generalized anxiety disorder   . CVA (cerebral vascular accident) ([place])   . Diastolic heart failure ([place])   . OA (osteoarthritis)   . Memory loss   . Dementia ([place])   . Hypokalemia   . Rectal bleeding   . Hemorrhoids     Orientation RESPIRATION BLADDER Height & Weight     Self, Time, Situation, Place  Normal Incontinent Weight: 171 lb 15.3 oz (78 kg) Height:  5\' 6"  (167.6 cm)  BEHAVIORAL SYMPTOMS/MOOD NEUROLOGICAL BOWEL NUTRITION STATUS      Continent Diet (no added salt)  AMBULATORY STATUS COMMUNICATION OF NEEDS Skin   Limited Assist Verbally Normal                       Personal Care Assistance Level of Assistance  Bathing, Feeding, Dressing Bathing Assistance: Limited assistance Feeding assistance: Limited assistance Dressing Assistance: Limited assistance     Functional Limitations Info   Sight, Hearing, Speech Sight Info: Adequate Hearing Info: Adequate Speech Info: Adequate    SPECIAL CARE FACTORS FREQUENCY                       Contractures Contractures Info: Not present    Additional Factors Info  Code Status, Allergies Code Status Info: FULL Allergies Info: 5-alpha Reductase Inhibitors           Current Medications (09/09/2020):  This is the current hospital active medication list Current Facility-Administered Medications  Medication Dose Route Frequency Provider Last Rate Last Admin  . 0.9 %  sodium chloride infusion  250 mL Intravenous PRN Johnson, Clanford L, MD      . acetaminophen (TYLENOL) tablet 650 mg  650 mg Oral Q6H PRN Johnson, Clanford L, MD       Or  . acetaminophen (TYLENOL) suppository 650 mg  650 mg Rectal Q6H PRN Johnson, Clanford L, MD      . cyanocobalamin ((VITAMIN B-12)) injection 1,000 mcg  1,000 mcg Intramuscular Once Johnson, Clanford L, MD      . cycloSPORINE (RESTASIS) 0.05 % ophthalmic emulsion 1 drop  1 drop Both Eyes Daily Johnson, Clanford L, MD   1 drop at 09/09/20 1031  . dicyclomine (BENTYL) capsule 20 mg  20 mg Oral BID Johnson, Clanford L, MD   20 mg at 09/09/20 1030  . fluticasone (FLONASE) 50 MCG/ACT nasal spray  2 spray  2 spray Each Nare Daily Johnson, Clanford L, MD   2 spray at 09/09/20 1030  . furosemide (LASIX) tablet 20 mg  20 mg Oral Daily Johnson, Clanford L, MD   20 mg at 09/09/20 1030  . gabapentin (NEURONTIN) capsule 100 mg  100 mg Oral TID Wynetta Emery, Clanford L, MD   100 mg at 09/09/20 1030  . LORazepam (ATIVAN) tablet 0.5 mg  0.5 mg Oral BID Johnson, Clanford L, MD   0.5 mg at 09/09/20 1030  . metoprolol tartrate (LOPRESSOR) injection 2.5 mg  2.5 mg Intravenous Q6H PRN Johnson, Clanford L, MD      . nicotine (NICODERM CQ - dosed in mg/24 hours) patch 14 mg  14 mg Transdermal Daily Johnson, Clanford L, MD   14 mg at 09/09/20 1032  . ondansetron (ZOFRAN) tablet 4 mg  4 mg Oral Q6H PRN Johnson, Clanford L,  MD       Or  . ondansetron (ZOFRAN) injection 4 mg  4 mg Intravenous Q6H PRN Wynetta Emery, Clanford L, MD   4 mg at 09/08/20 1833  . oxybutynin (DITROPAN-XL) 24 hr tablet 5 mg  5 mg Oral Daily Johnson, Clanford L, MD   5 mg at 09/09/20 1030  . pantoprazole (PROTONIX) EC tablet 40 mg  40 mg Oral BID Montez Morita, Quillian Quince, MD   40 mg at 09/09/20 1030  . PARoxetine (PAXIL) tablet 30 mg  30 mg Oral Daily Johnson, Clanford L, MD   30 mg at 09/09/20 1030  . polyvinyl alcohol (LIQUIFILM TEARS) 1.4 % ophthalmic solution 1 drop  1 drop Both Eyes BID PRN Johnson, Clanford L, MD      . risperiDONE (RISPERDAL) tablet 2 mg  2 mg Oral BID Johnson, Clanford L, MD   2 mg at 09/09/20 1030  . sodium chloride flush (NS) 0.9 % injection 3 mL  3 mL Intravenous Q12H Johnson, Clanford L, MD   3 mL at 09/09/20 1032  . sodium chloride flush (NS) 0.9 % injection 3 mL  3 mL Intravenous PRN Johnson, Clanford L, MD      . Vitamin D (Ergocalciferol) (DRISDOL) capsule 50,000 Units  50,000 Units Oral Q7 days Irwin Brakeman L, MD   50,000 Units at 09/08/20 1617     Discharge Medications:  TAKE these medications   acetaminophen 650 MG CR tablet Commonly known as: TYLENOL Take 650 mg by mouth every 8 (eight) hours as needed (back pain).   aspirin EC 81 MG tablet Take 1 tablet (81 mg total) by mouth daily with breakfast.   carboxymethylcellulose 0.5 % Soln Commonly known as: REFRESH PLUS Apply 1 drop to eye in the morning and at bedtime. Refresh Classic   CERTAVITE SENIOR/ANTIOXIDANT PO Take 1 tablet by mouth daily.   cyanocobalamin 1000 MCG/ML injection Commonly known as: (VITAMIN B-12) Inject 1 mL (1,000 mcg total) into the muscle every 30 (thirty) days.   Dextromethorphan-guaiFENesin 10-100 MG/5ML liquid Take 5 mLs by mouth every 12 (twelve) hours as needed (cough).   dicyclomine 20 MG tablet Commonly known as: BENTYL Take 20 mg by mouth 2 (two) times daily.   famotidine 20 MG tablet Commonly  known as: PEPCID Take 20 mg by mouth daily.   fenofibrate 145 MG tablet Commonly known as: TRICOR Take 145 mg by mouth daily.   fluticasone 50 MCG/ACT nasal spray Commonly known as: FLONASE Place 2 sprays into both nostrils daily.   furosemide 20 MG tablet Commonly known as: LASIX Take 20 mg by  mouth daily.   gabapentin 100 MG capsule Commonly known as: NEURONTIN Take 100 mg by mouth 3 (three) times daily.   LORazepam 0.5 MG tablet Commonly known as: ATIVAN Take 1 tablet (0.5 mg total) by mouth 2 (two) times daily.   nicotine 14 mg/24hr patch Commonly known as: NICODERM CQ - dosed in mg/24 hours Place 1 patch (14 mg total) onto the skin daily. Start taking on: September 10, 2020   ondansetron 4 MG tablet Commonly known as: ZOFRAN Take 1 tablet (4 mg total) by mouth every 6 (six) hours as needed for nausea.   oxybutynin 5 MG 24 hr tablet Commonly known as: DITROPAN-XL Take 5 mg by mouth daily.   pantoprazole 40 MG tablet Commonly known as: PROTONIX Take 1 tablet (40 mg total) by mouth 2 (two) times daily before a meal.   PARoxetine 30 MG tablet Commonly known as: PAXIL Take 30 mg by mouth daily.   polyethylene glycol 17 g packet Commonly known as: MIRALAX / GLYCOLAX Take 17 g by mouth daily.   Restasis 0.05 % ophthalmic emulsion Generic drug: cycloSPORINE Place 1 drop into both eyes daily.   risperiDONE 2 MG tablet Commonly known as: RISPERDAL Take 2 mg by mouth 2 (two) times daily.   senna-docusate 8.6-50 MG tablet Commonly known as: Senokot-S Take 2 tablets by mouth at bedtime.   vitamin A & D ointment Apply 1 application topically as needed for dry skin.   Vitamin D (Ergocalciferol) 1.25 MG (50000 UNIT) Caps capsule Commonly known as: DRISDOL Take 1 capsule (50,000 Units total) by mouth every 7 (seven) days. Start taking on: September 15, 2020        Relevant Imaging Results:  Relevant Lab Results:   Additional  [place] PT and RN ([facility] notified)  Shade Flood, LCSW

## 2020-09-10 NOTE — Telephone Encounter (Signed)
Referral sent [facility] via Epic.

## 2020-09-10 NOTE — Addendum Note (Signed)
Addended by: Hassan Rowan on: 09/10/2020 07:43 AM   Modules accepted: Orders

## 2020-10-13 ENCOUNTER — Telehealth: Payer: Self-pay | Admitting: *Deleted

## 2020-10-13 NOTE — Telephone Encounter (Signed)
Called [facility] and spoke with Joey. Was advised they do have referral and patient will be contacted

## 2020-10-19 NOTE — Telephone Encounter (Signed)
Fowarding to [place] to assist patient with appointment for HFU

## 2021-02-01 ENCOUNTER — Emergency Department (HOSPITAL_COMMUNITY)
Admission: EM | Admit: 2021-02-01 | Discharge: 2021-02-01 | Disposition: A | Discharge status: Home / Self Care | Payer: Medicare Other | Attending: Emergency Medicine | Admitting: Emergency Medicine

## 2021-02-01 ENCOUNTER — Emergency Department (HOSPITAL_COMMUNITY): Payer: Medicare Other

## 2021-02-01 ENCOUNTER — Other Ambulatory Visit: Payer: Self-pay

## 2021-02-01 ENCOUNTER — Encounter (HOSPITAL_COMMUNITY): Payer: Self-pay

## 2021-02-01 DIAGNOSIS — S63391A Traumatic rupture of other ligament of right wrist, initial encounter: Secondary | ICD-10-CM | POA: Diagnosis not present

## 2021-02-01 DIAGNOSIS — R531 Weakness: Secondary | ICD-10-CM | POA: Insufficient documentation

## 2021-02-01 DIAGNOSIS — I5030 Unspecified diastolic (congestive) heart failure: Secondary | ICD-10-CM | POA: Insufficient documentation

## 2021-02-01 DIAGNOSIS — Y92129 Unspecified place in nursing home as the place of occurrence of the external cause: Secondary | ICD-10-CM | POA: Insufficient documentation

## 2021-02-01 DIAGNOSIS — Z7982 Long term (current) use of aspirin: Secondary | ICD-10-CM | POA: Diagnosis not present

## 2021-02-01 DIAGNOSIS — F17200 Nicotine dependence, unspecified, uncomplicated: Secondary | ICD-10-CM | POA: Diagnosis not present

## 2021-02-01 DIAGNOSIS — W07XXXA Fall from chair, initial encounter: Secondary | ICD-10-CM | POA: Insufficient documentation

## 2021-02-01 DIAGNOSIS — S6991XA Unspecified injury of right wrist, hand and finger(s), initial encounter: Secondary | ICD-10-CM | POA: Diagnosis present

## 2021-02-01 DIAGNOSIS — M25331 Other instability, right wrist: Secondary | ICD-10-CM

## 2021-02-01 DIAGNOSIS — W19XXXA Unspecified fall, initial encounter: Secondary | ICD-10-CM

## 2021-02-01 DIAGNOSIS — J449 Chronic obstructive pulmonary disease, unspecified: Secondary | ICD-10-CM | POA: Insufficient documentation

## 2021-02-01 DIAGNOSIS — F0390 Unspecified dementia without behavioral disturbance: Secondary | ICD-10-CM | POA: Insufficient documentation

## 2021-02-01 DIAGNOSIS — I110 Hypertensive heart disease with heart failure: Secondary | ICD-10-CM | POA: Diagnosis not present

## 2021-02-01 LAB — CBC WITH DIFFERENTIAL/PLATELET
Abs Immature Granulocytes: 0.03 10*3/uL (ref 0.00–0.07)
Basophils Absolute: 0 10*3/uL (ref 0.0–0.1)
Basophils Relative: 1 %
Eosinophils Absolute: 0.3 10*3/uL (ref 0.0–0.5)
Eosinophils Relative: 6 %
HCT: 30.3 % — ABNORMAL LOW (ref 36.0–46.0)
Hemoglobin: 8.5 g/dL — ABNORMAL LOW (ref 12.0–15.0)
Immature Granulocytes: 1 %
Lymphocytes Relative: 30 %
Lymphs Abs: 1.6 10*3/uL (ref 0.7–4.0)
MCH: 21.9 pg — ABNORMAL LOW (ref 26.0–34.0)
MCHC: 28.1 g/dL — ABNORMAL LOW (ref 30.0–36.0)
MCV: 77.9 fL — ABNORMAL LOW (ref 80.0–100.0)
Monocytes Absolute: 0.4 10*3/uL (ref 0.1–1.0)
Monocytes Relative: 8 %
Neutro Abs: 2.9 10*3/uL (ref 1.7–7.7)
Neutrophils Relative %: 54 %
Platelets: 395 10*3/uL (ref 150–400)
RBC: 3.89 MIL/uL (ref 3.87–5.11)
RDW: 22.2 % — ABNORMAL HIGH (ref 11.5–15.5)
WBC: 5.3 10*3/uL (ref 4.0–10.5)
nRBC: 0 % (ref 0.0–0.2)

## 2021-02-01 LAB — BASIC METABOLIC PANEL
Anion gap: 9 (ref 5–15)
BUN: 12 mg/dL (ref 6–20)
CO2: 26 mmol/L (ref 22–32)
Calcium: 9.2 mg/dL (ref 8.9–10.3)
Chloride: 110 mmol/L (ref 98–111)
Creatinine, Ser: 0.84 mg/dL (ref 0.44–1.00)
GFR, Estimated: >60 mL/min (ref >60)
Glucose, Bld: 80 mg/dL (ref 70–99)
Potassium: 3.1 mmol/L — ABNORMAL LOW (ref 3.5–5.1)
Sodium: 145 mmol/L (ref 135–145)

## 2021-02-01 LAB — MAGNESIUM: Magnesium: 2 mg/dL (ref 1.7–2.4)

## 2021-02-01 LAB — TROPONIN I (HIGH SENSITIVITY): Troponin I (High Sensitivity): 7 ng/L (ref <18)

## 2021-02-01 MED ORDER — POTASSIUM CHLORIDE CRYS ER 20 MEQ PO TBCR: 20.0000 meq | EXTENDED_RELEASE_TABLET | Freq: Every day | ORAL | 0 refills | Status: DC

## 2021-02-01 MED ORDER — POTASSIUM CHLORIDE CRYS ER 20 MEQ PO TBCR
20.0000 meq | EXTENDED_RELEASE_TABLET | Freq: Once | ORAL | Status: AC
  Administered 2021-02-01: 20 meq via ORAL
  Filled 2021-02-01: qty 1

## 2021-02-01 NOTE — ED Notes (Signed)
Pt assisted with phone to call her sister Lavella Lemons at this time.

## 2021-02-01 NOTE — ED Notes (Signed)
Called for transport back to [facility].

## 2021-02-01 NOTE — ED Notes (Signed)
Pt transported to CT ?

## 2021-02-01 NOTE — ED Notes (Signed)
EMS at bedside to transport pt.

## 2021-02-01 NOTE — ED Triage Notes (Signed)
Patient outside at facility and fell while smoking. Patient complaining of generalized fall from pain, pain worse in lower back and lower extremities. Difficult to understand patient and what led to fall as she is making statements such as "3-4 bones came out of my back and Tasha put them back in." Patient from [place].

## 2021-02-01 NOTE — Discharge Instructions (Signed)
Karen Craig was seen in the ER today after her fall.  Her physical exam, x-rays, CT scan, and blood work are very reassuring.  X-ray of her wrist did show some injury to the ligament in the back of her hand.  For this reason she was placed in a splint and to follow-up with Dr. Aline Brochure, orthopedic doctor listed below.  She may have Tylenol or ibuprofen as needed for discomfort.   Her potassium was low and she was prescribed oral repletion x 5 days at home.  She should return to the emergency department if she develops any new severe symptoms.
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
## 2021-02-07 ENCOUNTER — Encounter: Payer: Self-pay | Admitting: Orthopedic Surgery

## 2021-02-07 ENCOUNTER — Ambulatory Visit (INDEPENDENT_AMBULATORY_CARE_PROVIDER_SITE_OTHER): Payer: Medicare Other | Admitting: Orthopedic Surgery

## 2021-02-07 VITALS — BP 124/78 | HR 80 | Ht 66.0 in

## 2021-02-07 DIAGNOSIS — S66911A Strain of unspecified muscle, fascia and tendon at wrist and hand level, right hand, initial encounter: Secondary | ICD-10-CM

## 2021-02-07 DIAGNOSIS — S63501A Unspecified sprain of right wrist, initial encounter: Secondary | ICD-10-CM
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
## 2021-03-14 ENCOUNTER — Other Ambulatory Visit: Payer: Self-pay

## 2021-03-14 ENCOUNTER — Encounter: Payer: Self-pay | Admitting: Orthopedic Surgery

## 2021-03-14 ENCOUNTER — Ambulatory Visit (INDEPENDENT_AMBULATORY_CARE_PROVIDER_SITE_OTHER): Payer: Medicare Other | Admitting: Orthopedic Surgery

## 2021-03-14 VITALS — BP 149/87 | HR 80

## 2021-03-14 DIAGNOSIS — S66911D Strain of unspecified muscle, fascia and tendon at wrist and hand level, right hand, subsequent encounter: Secondary | ICD-10-CM | POA: Diagnosis not present

## 2021-03-14 DIAGNOSIS — S63501A Unspecified sprain of right wrist, initial encounter: Secondary | ICD-10-CM

## 2021-03-14 NOTE — Progress Notes (Signed)
FOLLOW-UP OFFICE VISIT   Encounter Diagnosis  Name Primary?  . Sprain and strain of right wrist Yes    25 female at a nursing home sprained her wrist on the right we put her in a brace.  Her x-ray shows some scapholunate widening seems to be chronic  Her pain is improved but she is not pain-free  (and prior treatment)  + EXAM FINDINGS: The right wrist shows tenderness and swelling mild pain with range of motion seems to be improving  ASSESSMENT AND PLAN Sprain right wrist improving continue brace follow-up in 6 weeks

## 2021-03-14 NOTE — Progress Notes (Signed)
Chief Complaint  Patient presents with  . Wrist Pain    Right- still bothering me hurts when I move it

## 2021-04-25 ENCOUNTER — Ambulatory Visit: Payer: Medicare Other | Admitting: Orthopedic Surgery

## 2021-04-28 ENCOUNTER — Ambulatory Visit (INDEPENDENT_AMBULATORY_CARE_PROVIDER_SITE_OTHER): Payer: Medicare Other | Admitting: Orthopedic Surgery

## 2021-04-28 ENCOUNTER — Other Ambulatory Visit: Payer: Self-pay

## 2021-04-28 ENCOUNTER — Encounter: Payer: Self-pay | Admitting: Orthopedic Surgery

## 2021-04-28 VITALS — BP 111/61 | HR 74 | Ht 66.0 in | Wt 160.0 lb

## 2021-04-28 DIAGNOSIS — S63501A Unspecified sprain of right wrist, initial encounter: Secondary | ICD-10-CM

## 2021-04-28 DIAGNOSIS — S66911D Strain of unspecified muscle, fascia and tendon at wrist and hand level, right hand, subsequent encounter: Secondary | ICD-10-CM | POA: Diagnosis not present

## 2021-04-28 DIAGNOSIS — S63501D Unspecified sprain of right wrist, subsequent encounter: Secondary | ICD-10-CM | POA: Diagnosis not present

## 2021-04-28 NOTE — Progress Notes (Signed)
FOLLOW UP   Chief Complaint  Patient presents with   Wrist Injury    01/29/21 right wrist     Encounter Diagnosis  Name Primary?   Sprain and strain of right wrist Yes   DOI: 01/29/21   59 female at a nursing home sprained her wrist on the right we put her in a brace.  Her x-ray shows some scapholunate widening seems to be chronic   TREATMENT: brace follow-up in 6 weeks  EXAM TODAY UNREMARKABLE EXCEPT NORMAL ALIGNMNET NO PAN OR SWELLING   RELEASED

## 2021-04-28 NOTE — Patient Instructions (Signed)
Brace off 

## 2021-10-08 ENCOUNTER — Other Ambulatory Visit: Payer: Self-pay

## 2021-10-08 ENCOUNTER — Emergency Department (HOSPITAL_COMMUNITY)
Admission: EM | Admit: 2021-10-08 | Discharge: 2021-10-08 | Disposition: A | Discharge status: Home / Self Care | Payer: Medicare Other | Attending: Emergency Medicine | Admitting: Emergency Medicine

## 2021-10-08 ENCOUNTER — Emergency Department (HOSPITAL_COMMUNITY): Payer: Medicare Other

## 2021-10-08 ENCOUNTER — Encounter (HOSPITAL_COMMUNITY): Payer: Self-pay | Admitting: *Deleted

## 2021-10-08 DIAGNOSIS — Z7982 Long term (current) use of aspirin: Secondary | ICD-10-CM | POA: Insufficient documentation

## 2021-10-08 DIAGNOSIS — F17210 Nicotine dependence, cigarettes, uncomplicated: Secondary | ICD-10-CM | POA: Diagnosis not present

## 2021-10-08 DIAGNOSIS — I5030 Unspecified diastolic (congestive) heart failure: Secondary | ICD-10-CM | POA: Insufficient documentation

## 2021-10-08 DIAGNOSIS — J449 Chronic obstructive pulmonary disease, unspecified: Secondary | ICD-10-CM | POA: Diagnosis not present

## 2021-10-08 DIAGNOSIS — Z79899 Other long term (current) drug therapy: Secondary | ICD-10-CM | POA: Diagnosis not present

## 2021-10-08 DIAGNOSIS — Z7951 Long term (current) use of inhaled steroids: Secondary | ICD-10-CM | POA: Insufficient documentation

## 2021-10-08 DIAGNOSIS — W050XXA Fall from non-moving wheelchair, initial encounter: Secondary | ICD-10-CM | POA: Diagnosis not present

## 2021-10-08 DIAGNOSIS — Z23 Encounter for immunization: Secondary | ICD-10-CM | POA: Diagnosis not present

## 2021-10-08 DIAGNOSIS — F0390 Unspecified dementia without behavioral disturbance: Secondary | ICD-10-CM | POA: Diagnosis not present

## 2021-10-08 DIAGNOSIS — S0101XA Laceration without foreign body of scalp, initial encounter: Secondary | ICD-10-CM | POA: Diagnosis not present

## 2021-10-08 DIAGNOSIS — S8991XA Unspecified injury of right lower leg, initial encounter: Secondary | ICD-10-CM | POA: Diagnosis present

## 2021-10-08 DIAGNOSIS — S8391XA Sprain of unspecified site of right knee, initial encounter: Secondary | ICD-10-CM | POA: Insufficient documentation

## 2021-10-08 DIAGNOSIS — I110 Hypertensive heart disease with heart failure: Secondary | ICD-10-CM | POA: Insufficient documentation

## 2021-10-08 DIAGNOSIS — R0789 Other chest pain: Secondary | ICD-10-CM | POA: Diagnosis not present

## 2021-10-08 DIAGNOSIS — W19XXXA Unspecified fall, initial encounter: Secondary | ICD-10-CM

## 2021-10-08 HISTORY — DX: Anemia, unspecified: D64.9

## 2021-10-08 HISTORY — DX: Personal history of traumatic brain injury: Z87.820

## 2021-10-08 HISTORY — DX: Unspecified intellectual disabilities: F79

## 2021-10-08 MED ORDER — LIDOCAINE-EPINEPHRINE (PF) 1 %-1:200000 IJ SOLN
10.0000 mL | Freq: Once | INTRAMUSCULAR | Status: AC
  Administered 2021-10-08: 10 mL via INTRADERMAL
  Filled 2021-10-08: qty 30

## 2021-10-08 MED ORDER — TETANUS-DIPHTH-ACELL PERTUSSIS 5-2.5-18.5 LF-MCG/0.5 IM SUSY
0.5000 mL | PREFILLED_SYRINGE | Freq: Once | INTRAMUSCULAR | Status: AC
  Administered 2021-10-08: 0.5 mL via INTRAMUSCULAR
  Filled 2021-10-08: qty 0.5

## 2021-10-08 MED ORDER — ACETAMINOPHEN 325 MG PO TABS
650.0000 mg | ORAL_TABLET | Freq: Once | ORAL | Status: AC
  Administered 2021-10-08: 650 mg via ORAL
  Filled 2021-10-08: qty 2

## 2021-10-08 NOTE — Discharge Instructions (Signed)
You have 3 staples to your left scalp.  Clean the area gently with mild soap and water.  Staples out in 1 week.  The x-rays of your ribs, hip, and knee along with the CT of your head were all reassuring.  Continue Tylenol every 4-6 hours if needed for pain.  Follow-up with your primary care provider next week for recheck.  Return to the emergency department for any new or worsening symptoms.
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
## 2021-10-08 NOTE — ED Triage Notes (Signed)
Pt brought in by [facility] from [place] with c/o fall out of her wheelchair today. Pt fell forward and hit her head causing a laceration to side of left head. Bleeding controlled. Pt c/o pain to back, left hip, right knee, left rib cage area.

## 2021-10-15 ENCOUNTER — Emergency Department (HOSPITAL_COMMUNITY)
Admission: EM | Admit: 2021-10-15 | Discharge: 2021-10-15 | Disposition: A | Discharge status: Skilled Nursing Facility | Payer: Medicare Other | Attending: Emergency Medicine | Admitting: Emergency Medicine

## 2021-10-15 ENCOUNTER — Encounter (HOSPITAL_COMMUNITY): Payer: Self-pay | Admitting: *Deleted

## 2021-10-15 DIAGNOSIS — Z79899 Other long term (current) drug therapy: Secondary | ICD-10-CM | POA: Diagnosis not present

## 2021-10-15 DIAGNOSIS — Z7952 Long term (current) use of systemic steroids: Secondary | ICD-10-CM | POA: Insufficient documentation

## 2021-10-15 DIAGNOSIS — Z7982 Long term (current) use of aspirin: Secondary | ICD-10-CM | POA: Diagnosis not present

## 2021-10-15 DIAGNOSIS — J449 Chronic obstructive pulmonary disease, unspecified: Secondary | ICD-10-CM | POA: Diagnosis not present

## 2021-10-15 DIAGNOSIS — F0390 Unspecified dementia without behavioral disturbance: Secondary | ICD-10-CM | POA: Insufficient documentation

## 2021-10-15 DIAGNOSIS — F17210 Nicotine dependence, cigarettes, uncomplicated: Secondary | ICD-10-CM | POA: Diagnosis not present

## 2021-10-15 DIAGNOSIS — Z4802 Encounter for removal of sutures: Secondary | ICD-10-CM | POA: Insufficient documentation

## 2021-10-15 DIAGNOSIS — I5030 Unspecified diastolic (congestive) heart failure: Secondary | ICD-10-CM | POA: Insufficient documentation

## 2021-10-15 DIAGNOSIS — I110 Hypertensive heart disease with heart failure: Secondary | ICD-10-CM | POA: Insufficient documentation
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
## 2021-10-15 NOTE — ED Triage Notes (Signed)
Staple removal to left side of head

## 2021-10-15 NOTE — Discharge Instructions (Signed)
Recommend keeping the wound clean, may rinse off and as needed.  Over-the-counter pain medications as needed.  No need for follow-up unless symptoms worsen  follow-up with your PCP.  Come back to the emergency department if you develop chest pain, shortness of breath, severe abdominal pain, uncontrolled nausea, vomiting, diarrhea.

## 2021-12-02 LAB — TSH: TSH: 0.34 — AB (ref 0.41–5.90)

## 2021-12-05 ENCOUNTER — Emergency Department (HOSPITAL_COMMUNITY)
Admission: EM | Admit: 2021-12-05 | Discharge: 2021-12-06 | Disposition: A | Discharge status: Home / Self Care | Payer: Medicare Other | Attending: Emergency Medicine | Admitting: Emergency Medicine

## 2021-12-05 ENCOUNTER — Encounter (HOSPITAL_COMMUNITY): Payer: Self-pay

## 2021-12-05 ENCOUNTER — Other Ambulatory Visit: Payer: Self-pay

## 2021-12-05 DIAGNOSIS — D649 Anemia, unspecified: Secondary | ICD-10-CM

## 2021-12-05 DIAGNOSIS — R748 Abnormal levels of other serum enzymes: Secondary | ICD-10-CM | POA: Insufficient documentation

## 2021-12-05 DIAGNOSIS — I5030 Unspecified diastolic (congestive) heart failure: Secondary | ICD-10-CM | POA: Diagnosis not present

## 2021-12-05 DIAGNOSIS — E876 Hypokalemia: Secondary | ICD-10-CM | POA: Insufficient documentation

## 2021-12-05 DIAGNOSIS — Z7982 Long term (current) use of aspirin: Secondary | ICD-10-CM | POA: Insufficient documentation

## 2021-12-05 DIAGNOSIS — R7989 Other specified abnormal findings of blood chemistry: Secondary | ICD-10-CM | POA: Diagnosis present

## 2021-12-05 DIAGNOSIS — I110 Hypertensive heart disease with heart failure: Secondary | ICD-10-CM | POA: Insufficient documentation

## 2021-12-05 DIAGNOSIS — Z20822 Contact with and (suspected) exposure to covid-19: Secondary | ICD-10-CM | POA: Insufficient documentation

## 2021-12-05 DIAGNOSIS — J449 Chronic obstructive pulmonary disease, unspecified: Secondary | ICD-10-CM | POA: Insufficient documentation

## 2021-12-05 LAB — CBC WITH DIFFERENTIAL/PLATELET
Abs Immature Granulocytes: 0.03 10*3/uL (ref 0.00–0.07)
Basophils Absolute: 0 10*3/uL (ref 0.0–0.1)
Basophils Relative: 1 %
Eosinophils Absolute: 0.1 10*3/uL (ref 0.0–0.5)
Eosinophils Relative: 2 %
HCT: 28.9 % — ABNORMAL LOW (ref 36.0–46.0)
Hemoglobin: 7.8 g/dL — ABNORMAL LOW (ref 12.0–15.0)
Immature Granulocytes: 1 %
Lymphocytes Relative: 29 %
Lymphs Abs: 1.3 10*3/uL (ref 0.7–4.0)
MCH: 19.3 pg — ABNORMAL LOW (ref 26.0–34.0)
MCHC: 27 g/dL — ABNORMAL LOW (ref 30.0–36.0)
MCV: 71.5 fL — ABNORMAL LOW (ref 80.0–100.0)
Monocytes Absolute: 0.4 10*3/uL (ref 0.1–1.0)
Monocytes Relative: 8 %
Neutro Abs: 2.8 10*3/uL (ref 1.7–7.7)
Neutrophils Relative %: 59 %
Platelets: 307 10*3/uL (ref 150–400)
RBC: 4.04 MIL/uL (ref 3.87–5.11)
RDW: 21.3 % — ABNORMAL HIGH (ref 11.5–15.5)
WBC: 4.6 10*3/uL (ref 4.0–10.5)
nRBC: 0 % (ref 0.0–0.2)

## 2021-12-05 LAB — URINALYSIS, ROUTINE W REFLEX MICROSCOPIC
Bilirubin Urine: NEGATIVE
Glucose, UA: NEGATIVE mg/dL
Hgb urine dipstick: NEGATIVE
Ketones, ur: NEGATIVE mg/dL
Leukocytes,Ua: NEGATIVE
Nitrite: NEGATIVE
Protein, ur: NEGATIVE mg/dL
Specific Gravity, Urine: 1.02 (ref 1.005–1.030)
pH: 7.5 (ref 5.0–8.0)

## 2021-12-05 LAB — COMPREHENSIVE METABOLIC PANEL
ALT: 23 U/L (ref 0–44)
AST: 32 U/L (ref 15–41)
Albumin: 3.8 g/dL (ref 3.5–5.0)
Alkaline Phosphatase: 44 U/L (ref 38–126)
Anion gap: 2 — ABNORMAL LOW (ref 5–15)
BUN: 12 mg/dL (ref 6–20)
CO2: 30 mmol/L (ref 22–32)
Calcium: 9.1 mg/dL (ref 8.9–10.3)
Chloride: 111 mmol/L (ref 98–111)
Creatinine, Ser: 0.88 mg/dL (ref 0.44–1.00)
GFR, Estimated: >60 mL/min (ref >60)
Glucose, Bld: 91 mg/dL (ref 70–99)
Potassium: 3 mmol/L — ABNORMAL LOW (ref 3.5–5.1)
Sodium: 143 mmol/L (ref 135–145)
Total Bilirubin: 0.2 mg/dL — ABNORMAL LOW (ref 0.3–1.2)
Total Protein: 7.3 g/dL (ref 6.5–8.1)

## 2021-12-05 LAB — RESP PANEL BY RT-PCR (FLU A&B, COVID) ARPGX2
Influenza A by PCR: NEGATIVE
Influenza B by PCR: NEGATIVE
SARS Coronavirus 2 by RT PCR: NEGATIVE

## 2021-12-05 LAB — LIPASE, BLOOD: Lipase: 57 U/L — ABNORMAL HIGH (ref 11–51)

## 2021-12-05 LAB — MAGNESIUM: Magnesium: 1.9 mg/dL (ref 1.7–2.4)

## 2021-12-05 LAB — TSH: TSH: 0.321 u[IU]/mL — ABNORMAL LOW (ref 0.350–4.500)

## 2021-12-05 LAB — POC URINE PREG, ED: Preg Test, Ur: NEGATIVE

## 2021-12-05 LAB — POC OCCULT BLOOD, ED: Fecal Occult Bld: NEGATIVE

## 2021-12-05 LAB — T4, FREE: Free T4: 0.97 ng/dL (ref 0.61–1.12)

## 2021-12-05 MED ORDER — POTASSIUM CHLORIDE 20 MEQ PO PACK
40.0000 meq | PACK | Freq: Once | ORAL | Status: AC
  Administered 2021-12-05: 40 meq via ORAL
  Filled 2021-12-05: qty 2

## 2021-12-05 NOTE — ED Provider Notes (Signed)
Signout from Dr. Doren Custard.  Patient comes by EMS from [facility] assisted living facility for evaluation of abnormal lab work.  Thought to be low hemoglobin.  Possible GI bleed.  Patient was heme-negative from below with stable hemoglobin.  She is pending a urinalysis.  Disposition is likely back to facility plus minus antibiotics.  Physical Exam  BP (!) 180/98    Pulse 64    Temp 98.3 F (36.8 C) (Oral)    Resp 18    Ht 5\' 6"  (1.676 m)    Wt 72.5 kg    SpO2 98%    BMI 25.80 kg/m   Physical Exam  Procedures  Procedures  ED Course / MDM    Medical Decision Making Amount and/or Complexity of Data Reviewed Labs: ordered.  Risk Prescription drug management.   Patient's urinalysis not consistent with infection.  She will be returned to her facility where they can continue to observe her.       Hayden Rasmussen, MD 12/06/21 0930

## 2021-12-05 NOTE — ED Notes (Signed)
Pt guardian is confirmed at Karoline Caldwell with [employer]. Contact information updated in chart. Spoke with Charlann Boxer who is managing after hours line. Update provided and copy of discharge papers sent per her request. She confirms that Almyra Free with [employer] maintains full guardianship for pt. Attempted to call [facility] x3 to give report with no answer. Will attempt again.

## 2021-12-05 NOTE — ED Notes (Signed)
Pt denies SOB or dizziness. States she has had some blood in urine and pelvic pressure/pain at times. Abdomen non-tender at this time. Reports no issues with eating or drinking

## 2021-12-05 NOTE — ED Triage Notes (Addendum)
Patient sent to ED via EMS from [facility] with reported hgb of 8.0. Patient complains of leg pain, dizziness, and recent blood in stool.

## 2021-12-05 NOTE — ED Notes (Signed)
[facility] notified of patient needing transportation back to facility.

## 2021-12-05 NOTE — ED Notes (Signed)
Called pt's sister who is listed as guardian to give update. Sister, Lavella Lemons, states that per her request, she shares guardianship with a woman named Hoyle Sauer from [employer], but does not have her number d/t getting a new phone. Will attempt to get contact information from ALF.

## 2021-12-05 NOTE — ED Provider Notes (Signed)
[facility] EMERGENCY DEPARTMENT Provider Note   CSN: [id number] Arrival date & time: 12/05/21  1010     History  Chief Complaint  Patient presents with   abnormal labs    Karen Craig is a 56 y.o. female.  HPI Patient presents via EMS from [facility] assisted living facility.  There is concern of abnormal lab work.  Patient had lab work drawn on 1/28.  EMS believes that the concern was hemoglobin.  Patient is unaware of what the lab concern was.  Her medical history is notable for stroke, CHF, COPD, HTN, memory loss, history of TBI, schizophrenia.  Patient is a poor historian.  She does state that she has had recent abdominal pain, nausea, and vomiting.  She denies any current symptoms.    Home Medications Prior to Admission medications   Medication Sig Start Date End Date Taking? Authorizing Provider  aspirin EC 81 MG tablet Take 81 mg by mouth daily. Swallow whole.   Yes [provider]  carboxymethylcellulose (REFRESH PLUS) 0.5 % SOLN Apply 1 drop to eye in the morning and at bedtime. Refresh Classic   Yes [provider]  cyanocobalamin (,VITAMIN B-12,) 1000 MCG/ML injection Inject 1 mL (1,000 mcg total) into the muscle every 30 (thirty) days. 09/09/20  Yes Roxan Hockey, MD  Dextromethorphan-guaiFENesin 10-100 MG/5ML liquid Take 5 mLs by mouth every 8 (eight) hours as needed (cough).   Yes [provider]  dicyclomine (BENTYL) 20 MG tablet Take 20 mg by mouth 2 (two) times daily as needed (nausea/vomiting). 06/21/20  Yes [provider]  famotidine (PEPCID) 20 MG tablet Take 20 mg by mouth daily. 08/06/20  Yes [provider]  fenofibrate (TRICOR) 145 MG tablet Take 145 mg by mouth daily. 08/09/20  Yes [provider]  fluticasone (FLONASE) 50 MCG/ACT nasal spray Place 2 sprays into both nostrils daily. 08/04/20  Yes [provider]  furosemide (LASIX) 20 MG tablet Take 20 mg by mouth daily.   Yes [provider]  gabapentin (NEURONTIN) 100 MG capsule Take 100 mg by mouth 3 (three) times daily.   Yes [provider]  LORazepam (ATIVAN) 0.5 MG tablet Take 1 tablet (0.5 mg total) by mouth 2 (two) times daily. 09/09/20  Yes Emokpae, Courage, MD  Multiple Vitamins-Minerals (CERTAVITE SENIOR/ANTIOXIDANT PO) Take 1 tablet by mouth daily.    Yes [provider]  ondansetron (ZOFRAN) 4 MG tablet Take 1 tablet (4 mg total) by mouth every 6 (six) hours as needed for nausea. 09/09/20  Yes Emokpae, Courage, MD  oxybutynin (DITROPAN-XL) 5 MG 24 hr tablet Take 5 mg by mouth daily. 08/06/20  Yes [provider]  pantoprazole (PROTONIX) 40 MG tablet Take 1 tablet (40 mg total) by mouth 2 (two) times daily before a meal. 09/09/20  Yes Emokpae, Courage, MD  PARoxetine (PAXIL) 30 MG tablet Take 30 mg by mouth daily.   Yes [provider]  polyethylene glycol (MIRALAX / GLYCOLAX) packet Take 17 g by mouth daily.   Yes [provider]  RESTASIS 0.05 % ophthalmic emulsion Place 1 drop into both eyes 2 (two) times daily. 08/11/20  Yes [provider]  risperiDONE (RISPERDAL) 2 MG tablet Take 2 mg by mouth 2 (two) times daily.   Yes [provider]  sennosides-docusate sodium (SENOKOT-S) 8.6-50 MG tablet Take 2 tablets by mouth at bedtime.   Yes [provider]  Vitamin D, Ergocalciferol, (DRISDOL) 1.25 MG (50000 UNIT) CAPS capsule Take 1 capsule (  50,000 Units total) by mouth every 7 (seven) days. 09/15/20  Yes Roxan Hockey, MD  Vitamins A & D (VITAMIN A & D) ointment Apply 1 application topically as needed for dry skin.   Yes [provider]  acetaminophen (TYLENOL) 650 MG CR tablet Take 650 mg by mouth every 8 (eight) hours as needed (back pain).  Patient not taking: Reported on 10/08/2021    [provider]  medroxyPROGESTERone Acetate 150 MG/ML SUSY Inject 150 mg into the muscle every 3 (three) months. Patient not taking:  Reported on 12/05/2021 01/25/21   [provider]  nicotine (NICODERM CQ - DOSED IN MG/24 HOURS) 14 mg/24hr patch Place 1 patch (14 mg total) onto the skin daily. Patient not taking: Reported on 04/28/2021 09/10/20   Roxan Hockey, MD  potassium chloride SA (KLOR-CON) 20 MEQ tablet Take 1 tablet (20 mEq total) by mouth daily for 3 days. 02/01/21 02/04/21  Sponseller, Eugene Garnet R, PA-C      Allergies    5-alpha reductase inhibitors    Review of Systems   Review of Systems  Gastrointestinal:  Positive for abdominal pain, nausea and vomiting.  All other systems reviewed and are negative.  Physical Exam Updated Vital Signs BP 126/64    Pulse 60    Temp 98.8 F (37.1 C) (Oral)    Resp 17    Ht 5\' 6"  (1.676 m)    Wt 72.5 kg    SpO2 98%    BMI 25.80 kg/m  Physical Exam Vitals and nursing note reviewed.  Constitutional:      General: She is not in acute distress.    Appearance: She is well-developed. She is not ill-appearing, toxic-appearing or diaphoretic.  HENT:     Head: Normocephalic and atraumatic.     Right Ear: External ear normal.     Left Ear: External ear normal.     Nose: Nose normal.  Eyes:     General: No scleral icterus.    Extraocular Movements: Extraocular movements intact.     Conjunctiva/sclera: Conjunctivae normal.  Cardiovascular:     Rate and Rhythm: Normal rate and regular rhythm.     Heart sounds: No murmur heard. Pulmonary:     Effort: Pulmonary effort is normal. No respiratory distress.     Breath sounds: Normal breath sounds. No wheezing or rales.  Chest:     Chest wall: No tenderness.  Abdominal:     Palpations: Abdomen is soft.     Tenderness: There is no abdominal tenderness.  Musculoskeletal:        General: No swelling.     Cervical back: Neck supple. No rigidity.  Skin:    General: Skin is warm and dry.     Capillary Refill: Capillary refill takes less than 2 seconds.  Neurological:     Mental Status: She is alert.     Comments: Left  hemibody weakness that patient states is chronic.  Psychiatric:        Mood and Affect: Mood normal.        Behavior: Behavior normal.    ED Results / Procedures / Treatments   Labs (all labs ordered are listed, but only abnormal results are displayed) Labs Reviewed  COMPREHENSIVE METABOLIC PANEL - Abnormal; Notable for the following components:      Result Value   Potassium 3.0 (*)    Total Bilirubin 0.2 (*)    Anion gap 2 (*)    All other components within normal limits  LIPASE, BLOOD -  Abnormal; Notable for the following components:   Lipase 57 (*)    All other components within normal limits  CBC WITH DIFFERENTIAL/PLATELET - Abnormal; Notable for the following components:   Hemoglobin 7.8 (*)    HCT 28.9 (*)    MCV 71.5 (*)    MCH 19.3 (*)    MCHC 27.0 (*)    RDW 21.3 (*)    All other components within normal limits  URINALYSIS, ROUTINE W REFLEX MICROSCOPIC - Abnormal; Notable for the following components:   APPearance HAZY (*)    All other components within normal limits  TSH - Abnormal; Notable for the following components:   TSH 0.321 (*)    All other components within normal limits  RESP PANEL BY RT-PCR (FLU A&B, COVID) ARPGX2  MAGNESIUM  T4, FREE  T3, FREE  OCCULT BLOOD X 1 CARD TO LAB, STOOL  POC URINE PREG, ED  POC OCCULT BLOOD, ED    EKG None  Radiology No results found.  Procedures Procedures    Medications Ordered in ED Medications  potassium chloride (KLOR-CON) packet 40 mEq (40 mEq Oral Given 12/05/21 1247)    ED Course/ Medical Decision Making/ A&P                           Medical Decision Making Amount and/or Complexity of Data Reviewed Labs: ordered.  Risk Prescription drug management.   This patient presents to the ED for concern of abnormal lab results, nausea, and vomiting, this involves an extensive number of treatment options, and is a complaint that carries with it a high risk of complications and morbidity.  The  differential diagnosis includes GI bleed, gastritis, GERD, pancreatitis.  MDM:    56 year old female with history of stroke, TBI, memory loss, presenting from [facility] assisted living for concern of lab abnormalities.  It is unclear, on arrival, what lab abnormalities are of concern.  I did review recent documentation from labs drawn on 1/28.  Hemoglobin was 8.0, sodium was slightly high, TSH was slightly low.  On arrival, patient denies any current symptoms.  She does endorse some recent abdominal pain and vomiting, as well as a questionable episode of blood in her stool.  On exam, her abdomen is soft without any areas of tenderness.  She does have left hemibody weakness which she states is baseline for her.  She does have history of CVA.  Of note, patient is a very poor historian which is likely secondary to documented history of CVA, TBI, and schizophrenia.  Laboratory work-up was initiated.  Results of lab work were notable for hypokalemia.  Replacement potassium was given in the ED.  She has microcytic anemia with hemoglobin of 7.8 which, per chart review, is her baseline.  Lipase was very slightly elevated, not consistent with pancreatitis.  Sodium was normal.  Patient has no leukocytosis to suggest an acute inflammatory or infectious process.  When I attempted to call her living facility, call went unanswered.  Nursing was able to speak with the facility who did confirm that the lab value of concern was hemoglobin.  There was also concern of a recent episode of vomiting which was described as dark in color.  Today in the ED, patient's Hemoccult testing was negative.  This, in addition to her baseline hemoglobin, lowers suspicion of a GI bleed of any clinical significance.  Per chart review, patient is currently on Protonix.  Given that patient is currently at her  baseline hemoglobin, with reassuring work-up results and no complaints of symptoms during her ED observation, she is stable for discharge at  this time.   Labs: I Ordered, and personally interpreted labs.  The pertinent results include: Baseline microcytic anemia, no leukocytosis, slightly low TSH, and slightly elevated lipase, hypokalemia with otherwise normal electrolytes.  Additional history obtained from EMS, nursing facility.  External records from outside source obtained and reviewed including EMR  Critical Interventions: Potassium replacement for hypokalemia  Cardiac Monitoring: The patient was maintained on a cardiac monitor.  I personally viewed and interpreted the cardiac monitored which showed an underlying rhythm of: Sinus rhythm  Reevaluation: After the interventions noted above, I reevaluated the patient and found that they have :stayed the same  Social Determinants of Health: Cognitive deficits, psychiatric illness, lives in nursing facility  Disposition: Discharge  Co morbidities that complicate the patient evaluation  Past Medical History:  Diagnosis Date   Anemia    Anxiety    At risk for falling    Bipolar 1 disorder (HCC)    Chronic constipation    COPD (chronic obstructive pulmonary disease) ([place])    CVA (cerebral vascular accident) ([place])    left hemiplegia   Diastolic heart failure ([place])    Elevated liver enzymes    Generalized anxiety disorder    HTN (hypertension)    Intellectual disability    Joint disorder    Memory loss    OA (osteoarthritis)    OAB (overactive bladder)    Obesity    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[place])    Personal history of traumatic brain injury    Schizophrenia ([place])    Urge incontinence      Medicines Meds ordered this encounter  Medications   potassium chloride (KLOR-CON) packet 40 mEq    I have reviewed the patients home medicines and have made adjustments as needed  Problem List / ED Course: Problem List Items Addressed This Visit   None Visit Diagnoses     Anemia, unspecified type    -  Primary                   Final Clinical  Impression(s) / ED Diagnoses Final diagnoses:  Anemia, unspecified type    Rx / DC Orders ED Discharge Orders     None         Godfrey Pick, MD 12/06/21 [phone]

## 2021-12-05 NOTE — ED Notes (Signed)
Took pt iv out and got patient dressed. Pts belongings are with her!

## 2021-12-05 NOTE — ED Notes (Signed)
Spoke with representative with nursing facility to provide update. They report that a few day ago pt was vomiting dark brown, foul smelling emesis that they were concerned about but none observed for a few days. Pt assessed for bleeding. No blood in stool or that appears in urine at this time. She has not vomited in the ED and does not complain of nausea. EDP updated on information from facility.

## 2021-12-05 NOTE — Discharge Instructions (Signed)
You were seen in the emergency department for low blood count.  You had a rectal exam done that did not show any signs of bleeding.  Your hemoglobin was stable.  Please contact your primary care doctor for close follow-up.

## 2021-12-06 LAB — T3, FREE: T3, Free: 2.5 pg/mL (ref 2.0–4.4)

## 2021-12-06 NOTE — ED Notes (Signed)
Ems here for pt

## 2021-12-27 ENCOUNTER — Other Ambulatory Visit: Payer: Self-pay

## 2021-12-27 ENCOUNTER — Ambulatory Visit (INDEPENDENT_AMBULATORY_CARE_PROVIDER_SITE_OTHER): Payer: Medicare Other | Admitting: Nurse Practitioner

## 2021-12-27 ENCOUNTER — Encounter: Payer: Self-pay | Admitting: Nurse Practitioner

## 2021-12-27 VITALS — BP 130/78 | HR 54

## 2021-12-27 DIAGNOSIS — R5383 Other fatigue: Secondary | ICD-10-CM | POA: Diagnosis not present

## 2021-12-27 DIAGNOSIS — R7989 Other specified abnormal findings of blood chemistry: Secondary | ICD-10-CM
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
## 2021-12-27 NOTE — Patient Instructions (Signed)
Thyroid-Stimulating Hormone Test Why am I having this test? The thyroid is a gland in the lower front of the neck. It makes hormones that affect many body parts and systems, including the system that affects how quickly the body burns fuel for energy (metabolism). The pituitary gland is located just below the brain, behind the eyes and nasal passages. It helps maintain thyroid hormone levels and thyroid gland function. You may have a thyroid-stimulating hormone (TSH) test if you have possible symptoms of abnormal thyroid hormone levels. This test can help your health care provider: Diagnose a disorder of the thyroid gland or pituitary gland. Manage your condition and treatment if you have an underactive thyroid (hypothyroidism) or an overactive thyroid (hyperthyroidism). Newborn babies may have this test done to screen for hypothyroidism that is present at birth (congenital). What is being tested? This test measures the amount of TSH in your blood. TSH may also be called thyrotropin. When the thyroid does not make enough hormones, the pituitary gland releases TSH into the bloodstream to stimulate the thyroid gland to make more hormones. What kind of sample is taken?   A blood sample is required for this test. It is usually collected by inserting a needle into a blood vessel. For newborns, a small amount of blood may be collected from the umbilical cord, or by using a small needle to prick the baby's heel (heel stick). Tell a health care provider about: All medicines you are taking, including vitamins, herbs, eye drops, creams, and over-the-counter medicines. Any blood disorders you have. Any surgeries you have had. Any medical conditions you have. Whether you are pregnant or may be pregnant. How are the results reported? Your test results will be reported as a value that indicates how much TSH is in your blood. Your health care provider will compare your results to normal ranges that were  established after testing a large group of people (reference ranges). Reference ranges may vary among labs and hospitals. For this test, common reference ranges are: Adult: 2-10 microunits/mL or 2-10 milliunits/L. Newborn: Heel stick: 3-18 microunits/mL or 3-18 milliunits/L. Umbilical cord: 8-09 microunits/mL or 3-12 milliunits/L. What do the results mean? Results that are within the reference range are considered normal. This means that you have a normal amount of TSH in your blood. Results that are higher than the reference range mean that your TSH levels are too high. This may mean: Your thyroid gland is not making enough thyroid hormones. Your thyroid medicine dosage is too low. You have a tumor on your pituitary gland. This is rare. Results that are lower than the reference range mean that your TSH levels are too low. This may be caused by hyperthyroidism or by a problem with the pituitary gland function. Talk with your health care provider about what your results mean. Questions to ask your health care provider Ask your health care provider, or the department that is doing the test: When will my results be ready? How will I get my results? What are my treatment options? What other tests do I need? What are my next steps? Summary You may have a thyroid-stimulating hormone (TSH) test if you have possible symptoms of abnormal thyroid hormone levels. The thyroid is a gland in the lower front of the neck. It makes hormones that affect many body parts and systems. The pituitary gland is located just below the brain, behind the eyes and nasal passages. It helps maintain thyroid hormone levels and thyroid gland function. This test measures the  amount of TSH in your blood. TSH is made by the pituitary gland. It may also be called thyrotropin. This information is not intended to replace advice given to you by your health care provider. Make sure you discuss any questions you have with your  health care provider. Document Revised: 06/30/2021 Document Reviewed: 07/08/2020 Elsevier Patient Education  2022 [employer].

## 2022-01-03 ENCOUNTER — Ambulatory Visit: Payer: Medicare Other | Admitting: "Endocrinology

## 2022-01-03 LAB — THYROGLOBULIN ANTIBODY: Thyroglobulin Antibody: <1 IU/mL (ref 0.0–0.9)

## 2022-01-03 LAB — T4, FREE: Free T4: 1.34 ng/dL (ref 0.82–1.77)

## 2022-01-03 LAB — THYROID PEROXIDASE ANTIBODY: Thyroperoxidase Ab SerPl-aCnc: <9 IU/mL (ref 0–34)

## 2022-01-03 LAB — T3, FREE: T3, Free: 2.6 pg/mL (ref 2.0–4.4)

## 2022-01-03 LAB — TSH: TSH: 0.587 u[IU]/mL (ref 0.450–4.500)

## 2022-01-09 ENCOUNTER — Other Ambulatory Visit: Payer: Self-pay

## 2022-01-09 ENCOUNTER — Encounter: Payer: Self-pay | Admitting: Nurse Practitioner

## 2022-01-09 ENCOUNTER — Ambulatory Visit (INDEPENDENT_AMBULATORY_CARE_PROVIDER_SITE_OTHER): Payer: Medicare Other | Admitting: Nurse Practitioner

## 2022-01-09 VITALS — BP 149/85 | HR 61

## 2022-01-09 DIAGNOSIS — R7989 Other specified abnormal findings of blood chemistry: Secondary | ICD-10-CM

## 2022-01-09 DIAGNOSIS — R5383 Other fatigue: Secondary | ICD-10-CM | POA: Diagnosis not present
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
## 2022-07-12 ENCOUNTER — Ambulatory Visit: Payer: Medicare Other | Admitting: Nurse Practitioner

## 2022-07-20 LAB — TSH: TSH: 0.608 u[IU]/mL (ref 0.450–4.500)

## 2022-07-20 LAB — T3, FREE: T3, Free: 2.5 pg/mL (ref 2.0–4.4)

## 2022-07-20 LAB — T4, FREE: Free T4: 1.37 ng/dL (ref 0.82–1.77)

## 2022-07-26 ENCOUNTER — Encounter: Payer: Self-pay | Admitting: Nurse Practitioner

## 2022-07-26 ENCOUNTER — Ambulatory Visit (INDEPENDENT_AMBULATORY_CARE_PROVIDER_SITE_OTHER): Payer: Medicare Other | Admitting: Nurse Practitioner

## 2022-07-26 VITALS — BP 135/86 | HR 77

## 2022-07-26 DIAGNOSIS — E0590 Thyrotoxicosis, unspecified without thyrotoxic crisis or storm: Secondary | ICD-10-CM | POA: Diagnosis not present
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
## 2023-01-04 DIAGNOSIS — I5032 Chronic diastolic (congestive) heart failure: Secondary | ICD-10-CM | POA: Insufficient documentation

## 2023-01-04 DIAGNOSIS — N3281 Overactive bladder: Secondary | ICD-10-CM | POA: Insufficient documentation

## 2023-01-04 DIAGNOSIS — F0394 Unspecified dementia, unspecified severity, with anxiety: Secondary | ICD-10-CM | POA: Insufficient documentation

## 2023-01-04 DIAGNOSIS — G839 Paralytic syndrome, unspecified: Secondary | ICD-10-CM | POA: Insufficient documentation

## 2023-01-04 DIAGNOSIS — I110 Hypertensive heart disease with heart failure: Secondary | ICD-10-CM | POA: Insufficient documentation

## 2023-01-04 DIAGNOSIS — Z87820 Personal history of traumatic brain injury: Secondary | ICD-10-CM | POA: Insufficient documentation

## 2023-01-04 DIAGNOSIS — E785 Hyperlipidemia, unspecified: Secondary | ICD-10-CM | POA: Insufficient documentation

## 2023-01-05 DIAGNOSIS — M24672 Ankylosis, left ankle: Secondary | ICD-10-CM | POA: Insufficient documentation

## 2023-01-29 ENCOUNTER — Encounter: Payer: Self-pay | Admitting: Orthopedic Surgery

## 2023-01-29 ENCOUNTER — Other Ambulatory Visit (INDEPENDENT_AMBULATORY_CARE_PROVIDER_SITE_OTHER): Payer: Medicare Other

## 2023-01-29 ENCOUNTER — Telehealth: Payer: Self-pay | Admitting: Orthopedic Surgery

## 2023-01-29 ENCOUNTER — Ambulatory Visit (INDEPENDENT_AMBULATORY_CARE_PROVIDER_SITE_OTHER): Payer: Medicare Other | Admitting: Orthopedic Surgery

## 2023-01-29 VITALS — BP 146/75 | HR 53

## 2023-01-29 DIAGNOSIS — M25572 Pain in left ankle and joints of left foot: Secondary | ICD-10-CM | POA: Diagnosis not present

## 2023-01-29 MED ORDER — IBUPROFEN 400 MG PO TABS: 400.0000 mg | ORAL_TABLET | Freq: Three times a day (TID) | ORAL | 0 refills | Status: DC | PRN

## 2023-01-29 NOTE — Progress Notes (Signed)
Chief Complaint  Patient presents with   Ankle Pain    New Problem LT ankle pain   57 year old female new onset pain and swelling left ankle no history of trauma  Patient x-rays show that she had a prior distal tibia fracture no metal or hardware seen  She also complains of pain in the middle of her foot  Examination shows a swelling on the lateral ankle with a 1-2+ drawer sign positive laxity with inversion tenderness over the midfoot and anterolateral ankle  X-rays again no evidence of fracture there does appear to be arthritis in the midfoot and previous tibia fracture which looks to have been treated closed  Assessment and plan  Unclear etiology swelling left ankle with laxity with history of underlying previous tibia fracture which shows some malalignment but not enough to affect the ankle joint itself and midfoot arthritis  Recommend Tylenol, Advil, ice and Ace wrap  Patient is nonambulatory  Meds ordered this encounter  Medications   ibuprofen (ADVIL) 400 MG tablet    Sig: Take 1 tablet (400 mg total) by mouth every 8 (eight) hours as needed.    Dispense:  90 tablet    Refill:  0

## 2023-01-29 NOTE — Telephone Encounter (Signed)
Dr. Ruthe Mannan pt - [facility] [phone] called, lvm stating that Dr. Aline Brochure prescribed Ibuprofen 400mg  today and Dr. Cristie Hem (sp?) the supervising provider at the nursing home has already prescribed Meloxicam 5mg  and she wanted Dr. Aline Brochure to be aware this is duplicate therapy.  She would like a call back.

## 2023-01-30 ENCOUNTER — Other Ambulatory Visit: Payer: Self-pay | Admitting: Orthopedic Surgery

## 2023-01-30 DIAGNOSIS — M25572 Pain in left ankle and joints of left foot: Secondary | ICD-10-CM

## 2023-01-30 NOTE — Telephone Encounter (Signed)
  Rx #: [id number]    Pharmacy comment: Patient is currently taking meloxicam. Ibuprofen is a duplicate NSAIDs, which should not be taken together due to increased risk of bleeding.     To be filled at: [facility]

## 2023-01-31 ENCOUNTER — Telehealth: Payer: Self-pay | Admitting: Radiology

## 2023-01-31 NOTE — Telephone Encounter (Signed)
Completed a prior authorization request for the diclofenac gel On cover my meds.

## 2023-02-26 ENCOUNTER — Ambulatory Visit (INDEPENDENT_AMBULATORY_CARE_PROVIDER_SITE_OTHER): Payer: Medicare Other | Admitting: Orthopedic Surgery

## 2023-02-26 ENCOUNTER — Encounter: Payer: Self-pay | Admitting: Orthopedic Surgery

## 2023-02-26 VITALS — BP 142/93 | HR 74

## 2023-02-26 DIAGNOSIS — R600 Localized edema: Secondary | ICD-10-CM

## 2023-02-26 NOTE — Patient Instructions (Signed)
Keep the wrap off of the LT ankle  Wear compression hose on left leg. An order has been provided.

## 2023-02-26 NOTE — Progress Notes (Signed)
Chief Complaint  Patient presents with   Ankle Pain    Left- still swpllen and can stand on it for a short period of time seems to bother her when she puts weight on it.    Patient comes in again with unexplained left ankle and foot pain.  Again she had some type of tibial internal fixation in the past.  I do not see anything other than chronic edema  Encounter Diagnosis  Name Primary?   Peripheral edema Yes    Recommend compression hose  No follow-up needed

## 2023-03-27 ENCOUNTER — Telehealth: Payer: Self-pay | Admitting: Orthopedic Surgery

## 2023-03-27 NOTE — Telephone Encounter (Signed)
Dr. Mort Sawyers pt - spoke to someone from [facility] [phone], she stated that [facility] is stating that the order does not have the length/size that the patient needs/should have.

## 2023-03-27 NOTE — Telephone Encounter (Signed)
Now [facility] is calling  Dr. Mort Sawyers pt - spoke to someone from [facility] [phone], she stated that [facility] is stating that the order does not have the length/size that the patient needs/should have.

## 2023-03-27 NOTE — Telephone Encounter (Signed)
[facility] is requesting a d /c order for compression stockings faxed to  [phone]

## 2023-03-27 NOTE — Telephone Encounter (Signed)
I called to advise [facility] usually measures for size  We put the compression strength and knee high or thigh high on the paper we give patient  Fax [phone] I told her we will be resending the order NOT to d/c because your note states d/c the wrap and that's the only other thing they have

## 2023-03-28 NOTE — Telephone Encounter (Signed)
Faxed to [phone]

## 2023-04-17 ENCOUNTER — Other Ambulatory Visit: Payer: Self-pay | Admitting: Orthopedic Surgery

## 2023-04-17 DIAGNOSIS — M25572 Pain in left ankle and joints of left foot: Secondary | ICD-10-CM

## 2023-05-16 ENCOUNTER — Emergency Department (HOSPITAL_COMMUNITY)
Admission: EM | Admit: 2023-05-16 | Discharge: 2023-05-16 | Payer: Medicare Other | Attending: Emergency Medicine | Admitting: Emergency Medicine

## 2023-05-16 ENCOUNTER — Other Ambulatory Visit: Payer: Self-pay

## 2023-05-16 ENCOUNTER — Emergency Department (HOSPITAL_COMMUNITY): Payer: Medicare Other

## 2023-05-16 ENCOUNTER — Encounter (HOSPITAL_COMMUNITY): Payer: Self-pay | Admitting: Emergency Medicine

## 2023-05-16 DIAGNOSIS — I10 Essential (primary) hypertension: Secondary | ICD-10-CM | POA: Diagnosis not present

## 2023-05-16 DIAGNOSIS — R109 Unspecified abdominal pain: Secondary | ICD-10-CM | POA: Diagnosis present

## 2023-05-16 DIAGNOSIS — R197 Diarrhea, unspecified: Secondary | ICD-10-CM | POA: Diagnosis not present

## 2023-05-16 DIAGNOSIS — R1110 Vomiting, unspecified: Secondary | ICD-10-CM | POA: Diagnosis not present

## 2023-05-16 DIAGNOSIS — J449 Chronic obstructive pulmonary disease, unspecified: Secondary | ICD-10-CM | POA: Diagnosis not present

## 2023-05-16 DIAGNOSIS — F17210 Nicotine dependence, cigarettes, uncomplicated: Secondary | ICD-10-CM | POA: Insufficient documentation

## 2023-05-16 LAB — CBC
HCT: 33.9 % — ABNORMAL LOW (ref 36.0–46.0)
Hemoglobin: 11.3 g/dL — ABNORMAL LOW (ref 12.0–15.0)
MCH: 31.1 pg (ref 26.0–34.0)
MCHC: 33.3 g/dL (ref 30.0–36.0)
MCV: 93.4 fL (ref 80.0–100.0)
Platelets: 244 10*3/uL (ref 150–400)
RBC: 3.63 MIL/uL — ABNORMAL LOW (ref 3.87–5.11)
RDW: 14.4 % (ref 11.5–15.5)
WBC: 5 10*3/uL (ref 4.0–10.5)
nRBC: 0 % (ref 0.0–0.2)

## 2023-05-16 LAB — COMPREHENSIVE METABOLIC PANEL
ALT: 23 U/L (ref 0–44)
AST: 30 U/L (ref 15–41)
Albumin: 3.4 g/dL — ABNORMAL LOW (ref 3.5–5.0)
Alkaline Phosphatase: 34 U/L — ABNORMAL LOW (ref 38–126)
Anion gap: 6 (ref 5–15)
BUN: 17 mg/dL (ref 6–20)
CO2: 27 mmol/L (ref 22–32)
Calcium: 9 mg/dL (ref 8.9–10.3)
Chloride: 108 mmol/L (ref 98–111)
Creatinine, Ser: 0.87 mg/dL (ref 0.44–1.00)
GFR, Estimated: >60 mL/min (ref >60)
Glucose, Bld: 94 mg/dL (ref 70–99)
Potassium: 3.6 mmol/L (ref 3.5–5.1)
Sodium: 141 mmol/L (ref 135–145)
Total Bilirubin: 0.5 mg/dL (ref 0.3–1.2)
Total Protein: 6.4 g/dL — ABNORMAL LOW (ref 6.5–8.1)

## 2023-05-16 LAB — LIPASE, BLOOD: Lipase: 50 U/L (ref 11–51)

## 2023-05-16 MED ORDER — SODIUM CHLORIDE 0.9 % IV BOLUS
1000.0000 mL | Freq: Once | INTRAVENOUS | Status: AC
  Administered 2023-05-16: 1000 mL via INTRAVENOUS

## 2023-05-16 MED ORDER — ONDANSETRON HCL 4 MG/2ML IJ SOLN
4.0000 mg | Freq: Once | INTRAMUSCULAR | Status: AC
  Administered 2023-05-16: 4 mg via INTRAVENOUS
  Filled 2023-05-16: qty 2

## 2023-05-16 MED ORDER — SUCRALFATE 1 G PO TABS: 1.0000 g | ORAL_TABLET | Freq: Four times a day (QID) | ORAL | 0 refills | Status: DC | PRN

## 2023-05-16 MED ORDER — IOHEXOL 300 MG/ML  SOLN
100.0000 mL | Freq: Once | INTRAMUSCULAR | Status: AC | PRN
  Administered 2023-05-16: 100 mL via INTRAVENOUS
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
## 2023-05-16 NOTE — Discharge Instructions (Addendum)
You were evaluated in the Emergency Department and after careful evaluation, we did not find any emergent condition requiring admission or further testing in the hospital.  We found some abnormalities on your CT scan that may suggest stomach cancer.  You need further testing with the gastroenterologist.  Call the number provided to set up an appointment.  Continue your Zofran and pantoprazole at home.  Can also take the Carafate medication prescribed as needed for discomfort.  Please return to the Emergency Department if you experience any worsening of your condition.   Thank you for allowing [place] to be a part of your care.

## 2023-05-16 NOTE — ED Triage Notes (Signed)
Pt to ed from [facility] via [facility]. C/o of emesis that started this morning along with some diarrhea. Pt states started with "belly aches" yesterday.

## 2023-05-24 ENCOUNTER — Encounter: Payer: Self-pay | Admitting: *Deleted

## 2023-05-24 ENCOUNTER — Encounter: Payer: Self-pay | Admitting: Internal Medicine

## 2023-05-24 ENCOUNTER — Encounter (INDEPENDENT_AMBULATORY_CARE_PROVIDER_SITE_OTHER): Payer: Self-pay | Admitting: *Deleted

## 2023-05-24 ENCOUNTER — Ambulatory Visit (INDEPENDENT_AMBULATORY_CARE_PROVIDER_SITE_OTHER): Payer: Medicare Other | Admitting: Internal Medicine

## 2023-05-24 VITALS — BP 152/84 | HR 53 | Temp 98.6°F | Wt 167.0 lb

## 2023-05-24 DIAGNOSIS — D509 Iron deficiency anemia, unspecified: Secondary | ICD-10-CM

## 2023-05-24 DIAGNOSIS — K2100 Gastro-esophageal reflux disease with esophagitis, without bleeding: Secondary | ICD-10-CM | POA: Diagnosis not present

## 2023-05-24 DIAGNOSIS — R935 Abnormal findings on diagnostic imaging of other abdominal regions, including retroperitoneum: Secondary | ICD-10-CM | POA: Diagnosis not present

## 2023-05-24 DIAGNOSIS — K449 Diaphragmatic hernia without obstruction or gangrene: Secondary | ICD-10-CM

## 2023-05-24 NOTE — Patient Instructions (Addendum)
We will schedule you for upper endoscopy to further evaluate your nausea vomiting, abnormal CT imaging.  Continue on pantoprazole twice daily.  Further recommendations to follow.  It was very nice seeing all of you today.  Dr. Marletta Lor
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
## 2023-05-25 ENCOUNTER — Encounter (HOSPITAL_COMMUNITY): Payer: Self-pay

## 2023-05-25 ENCOUNTER — Encounter (HOSPITAL_COMMUNITY)
Admission: RE | Admit: 2023-05-25 | Discharge: 2023-05-25 | Disposition: A | Discharge status: Home / Self Care | Payer: Medicare Other | Source: Ambulatory Visit | Attending: Internal Medicine | Admitting: Internal Medicine

## 2023-05-25 NOTE — Patient Instructions (Signed)
Karen Craig  05/25/2023     @PREFPERIOPPHARMACY @   Your procedure is scheduled on  05/28/2023.   Report to [facility] at  1230  P.M.   Call this number if you have problems the morning of surgery:  [phone]  If you experience any cold or flu symptoms such as cough, fever, chills, shortness of breath, etc. between now and your scheduled surgery, please notify us at the above number.   Remember:  Follow the diet instructions given to you by the office.     Take these medicines the morning of surgery with A SIP OF WATER      pepcid, gabapentin, lorazepam, meloxicam, oxybutynin, pantoprazole, paxil, risperidone.     Do not wear jewelry, make-up or nail polish, including gel polish,  artificial nails, or any other type of covering on natural nails (fingers and  toes).  Do not wear lotions, powders, or perfumes, or deodorant.  Do not shave 48 hours prior to surgery.  Men may shave face and neck.  Do not bring valuables to the hospital.  [facility] is not responsible for any belongings or valuables.  Contacts, dentures or bridgework may not be worn into surgery.  Leave your suitcase in the car.  After surgery it may be brought to your room.  For patients admitted to the hospital, discharge time will be determined by your treatment team.  Patients discharged the day of surgery will not be allowed to drive home and must have someone with them for 24 hours.    Special instructions:   DO NOT smoke tobacco or vape for 24 hours before your procedure.  Please read over the following fact sheets that you were given. Anesthesia Post-op Instructions and Care and Recovery After Surgery      Upper Endoscopy, Adult, Care After After the procedure, it is common to have a sore throat. It is also common to have: Mild stomach pain or discomfort. Bloating. Nausea. Follow these instructions at home: The instructions below may help you care for yourself at home. Your health  care provider may give you more instructions. If you have questions, ask your health care provider. If you were given a sedative during the procedure, it can affect you for several hours. Do not drive or operate machinery until your health care provider says that it is safe. If you will be going home right after the procedure, plan to have a responsible adult: Take you home from the hospital or clinic. You will not be allowed to drive. Care for you for the time you are told. Follow instructions from your health care provider about what you may eat and drink. Return to your normal activities as told by your health care provider. Ask your health care provider what activities are safe for you. Take over-the-counter and prescription medicines only as told by your health care provider. Contact a health care provider if you: Have a sore throat that lasts longer than one day. Have trouble swallowing. Have a fever. Get help right away if you: Vomit blood or your vomit looks like coffee grounds. Have bloody, black, or tarry stools. Have a very bad sore throat or you cannot swallow. Have difficulty breathing or very bad pain in your chest or abdomen. These symptoms may be an emergency. Get help right away. Call 911. Do not wait to see if the symptoms will go away. Do not drive yourself to the hospital. Summary After the procedure, it is  common to have a sore throat, mild stomach discomfort, bloating, and nausea. If you were given a sedative during the procedure, it can affect you for several hours. Do not drive until your health care provider says that it is safe. Follow instructions from your health care provider about what you may eat and drink. Return to your normal activities as told by your health care provider. This information is not intended to replace advice given to you by your health care provider. Make sure you discuss any questions you have with your health care provider. Document  Revised: 02/01/2022 Document Reviewed: 02/01/2022 Elsevier Patient Education  2024 [employer] Monitored Anesthesia Care, Care After The following information offers guidance on how to care for yourself after your procedure. Your health care provider may also give you more specific instructions. If you have problems or questions, contact your health care provider. What can I expect after the procedure? After the procedure, it is common to have: Tiredness. Little or no memory about what happened during or after the procedure. Impaired judgment when it comes to making decisions. Nausea or vomiting. Some trouble with balance. Follow these instructions at home: For the time period you were told by your health care provider:  Rest. Do not participate in activities where you could fall or become injured. Do not drive or use machinery. Do not drink alcohol. Do not take sleeping pills or medicines that cause drowsiness. Do not make important decisions or sign legal documents. Do not take care of children on your own. Medicines Take over-the-counter and prescription medicines only as told by your health care provider. If you were prescribed antibiotics, take them as told by your health care provider. Do not stop using the antibiotic even if you start to feel better. Eating and drinking Follow instructions from your health care provider about what you may eat and drink. Drink enough fluid to keep your urine pale yellow. If you vomit: Drink clear fluids slowly and in small amounts as you are able. Clear fluids include water, ice chips, low-calorie sports drinks, and fruit juice that has water added to it (diluted fruit juice). Eat light and bland foods in small amounts as you are able. These foods include bananas, applesauce, rice, lean meats, toast, and crackers. General instructions  Have a responsible adult stay with you for the time you are told. It is important to have someone help care  for you until you are awake and alert. If you have sleep apnea, surgery and some medicines can increase your risk for breathing problems. Follow instructions from your health care provider about wearing your sleep device: When you are sleeping. This includes during daytime naps. While taking prescription pain medicines, sleeping medicines, or medicines that make you drowsy. Do not use any products that contain nicotine or tobacco. These products include cigarettes, chewing tobacco, and vaping devices, such as e-cigarettes. If you need help quitting, ask your health care provider. Contact a health care provider if: You feel nauseous or vomit every time you eat or drink. You feel light-headed. You are still sleepy or having trouble with balance after 24 hours. You get a rash. You have a fever. You have redness or swelling around the IV site. Get help right away if: You have trouble breathing. You have new confusion after you get home. These symptoms may be an emergency. Get help right away. Call 911. Do not wait to see if the symptoms will go away. Do not drive yourself to the hospital.  This information is not intended to replace advice given to you by your health care provider. Make sure you discuss any questions you have with your health care provider. Document Revised: 03/20/2022 Document Reviewed: 03/20/2022 Elsevier Patient Education  2024 [employer].

## 2023-05-28 ENCOUNTER — Ambulatory Visit (HOSPITAL_BASED_OUTPATIENT_CLINIC_OR_DEPARTMENT_OTHER): Payer: Medicare Other | Admitting: Anesthesiology

## 2023-05-28 ENCOUNTER — Encounter (HOSPITAL_COMMUNITY): Payer: Self-pay

## 2023-05-28 ENCOUNTER — Encounter (HOSPITAL_COMMUNITY)
Admission: RE | Disposition: A | Discharge status: Nursing Home (Includes ICF) | Payer: Self-pay | Source: Home / Self Care | Attending: Internal Medicine

## 2023-05-28 ENCOUNTER — Ambulatory Visit (HOSPITAL_COMMUNITY): Payer: Medicare Other | Admitting: Anesthesiology

## 2023-05-28 ENCOUNTER — Ambulatory Visit (HOSPITAL_COMMUNITY)
Admission: RE | Admit: 2023-05-28 | Discharge: 2023-05-28 | Disposition: A | Discharge status: Nursing Home (Includes ICF) | Payer: Medicare Other | Attending: Internal Medicine | Admitting: Internal Medicine

## 2023-05-28 DIAGNOSIS — I5032 Chronic diastolic (congestive) heart failure: Secondary | ICD-10-CM

## 2023-05-28 DIAGNOSIS — R109 Unspecified abdominal pain: Secondary | ICD-10-CM | POA: Diagnosis present

## 2023-05-28 DIAGNOSIS — K2950 Unspecified chronic gastritis without bleeding: Secondary | ICD-10-CM | POA: Diagnosis not present

## 2023-05-28 DIAGNOSIS — Z79899 Other long term (current) drug therapy: Secondary | ICD-10-CM | POA: Diagnosis not present

## 2023-05-28 DIAGNOSIS — E669 Obesity, unspecified: Secondary | ICD-10-CM | POA: Diagnosis not present

## 2023-05-28 DIAGNOSIS — J449 Chronic obstructive pulmonary disease, unspecified: Secondary | ICD-10-CM | POA: Insufficient documentation

## 2023-05-28 DIAGNOSIS — F79 Unspecified intellectual disabilities: Secondary | ICD-10-CM | POA: Insufficient documentation

## 2023-05-28 DIAGNOSIS — F419 Anxiety disorder, unspecified: Secondary | ICD-10-CM | POA: Diagnosis not present

## 2023-05-28 DIAGNOSIS — F319 Bipolar disorder, unspecified: Secondary | ICD-10-CM | POA: Diagnosis not present

## 2023-05-28 DIAGNOSIS — F17210 Nicotine dependence, cigarettes, uncomplicated: Secondary | ICD-10-CM | POA: Diagnosis not present

## 2023-05-28 DIAGNOSIS — K259 Gastric ulcer, unspecified as acute or chronic, without hemorrhage or perforation: Secondary | ICD-10-CM | POA: Insufficient documentation

## 2023-05-28 DIAGNOSIS — K449 Diaphragmatic hernia without obstruction or gangrene: Secondary | ICD-10-CM

## 2023-05-28 DIAGNOSIS — K2970 Gastritis, unspecified, without bleeding: Secondary | ICD-10-CM | POA: Insufficient documentation

## 2023-05-28 DIAGNOSIS — R112 Nausea with vomiting, unspecified: Secondary | ICD-10-CM | POA: Diagnosis not present

## 2023-05-28 DIAGNOSIS — I110 Hypertensive heart disease with heart failure: Secondary | ICD-10-CM

## 2023-05-28 HISTORY — PX: BIOPSY: SHX5522

## 2023-05-28 HISTORY — PX: ESOPHAGOGASTRODUODENOSCOPY (EGD) WITH PROPOFOL: SHX5813

## 2023-05-28 SURGERY — ESOPHAGOGASTRODUODENOSCOPY (EGD) WITH PROPOFOL
Anesthesia: General

## 2023-05-28 MED ORDER — PROPOFOL 10 MG/ML IV BOLUS
INTRAVENOUS | Status: AC
  Filled 2023-05-28: qty 20

## 2023-05-28 MED ORDER — LIDOCAINE HCL (CARDIAC) PF 100 MG/5ML IV SOSY
PREFILLED_SYRINGE | INTRAVENOUS | Status: DC | PRN
  Administered 2023-05-28: 100 mg via INTRAVENOUS

## 2023-05-28 MED ORDER — LIDOCAINE HCL (PF) 2 % IJ SOLN
INTRAMUSCULAR | Status: AC
  Filled 2023-05-28: qty 5

## 2023-05-28 MED ORDER — LACTATED RINGERS IV SOLN: INTRAVENOUS | Status: DC

## 2023-05-28 MED ORDER — PROPOFOL 10 MG/ML IV BOLUS
INTRAVENOUS | Status: DC | PRN
Start: 2023-05-28 — End: 2023-05-28
  Administered 2023-05-28: 100 mg via INTRAVENOUS

## 2023-05-28 MED ORDER — PROPOFOL 500 MG/50ML IV EMUL
INTRAVENOUS | Status: DC | PRN
  Administered 2023-05-28: 175 ug/kg/min via INTRAVENOUS

## 2023-05-28 NOTE — Anesthesia Preprocedure Evaluation (Signed)
Anesthesia Evaluation  Patient identified by MRN, date of birth, ID band Patient awake    Reviewed: Allergy & Precautions, H&P , NPO status , Patient's Chart, lab work & pertinent test results, reviewed documented beta blocker date and time   Airway Mallampati: II  TM Distance: >3 FB Neck ROM: full    Dental no notable dental hx.    Pulmonary neg pulmonary ROS, COPD, Current Smoker   Pulmonary exam normal breath sounds clear to auscultation       Cardiovascular Exercise Tolerance: Good hypertension, +CHF  negative cardio ROS  Rhythm:regular Rate:Normal     Neuro/Psych  PSYCHIATRIC DISORDERS Anxiety  Bipolar Disorder Schizophrenia Dementia CVA negative neurological ROS  negative psych ROS   GI/Hepatic negative GI ROS, Neg liver ROS,,,  Endo/Other  negative endocrine ROS    Renal/GU negative Renal ROS  negative genitourinary   Musculoskeletal   Abdominal   Peds  Hematology negative hematology ROS (+) Blood dyscrasia, anemia   Anesthesia Other Findings   Reproductive/Obstetrics negative OB ROS                             Anesthesia Physical Anesthesia Plan  ASA: 3  Anesthesia Plan: General   Post-op Pain Management:    Induction:   PONV Risk Score and Plan: Propofol infusion  Airway Management Planned:   Additional Equipment:   Intra-op Plan:   Post-operative Plan:   Informed Consent: I have reviewed the patients History and Physical, chart, labs and discussed the procedure including the risks, benefits and alternatives for the proposed anesthesia with the patient or authorized representative who has indicated his/her understanding and acceptance.     Dental Advisory Given  Plan Discussed with: CRNA  Anesthesia Plan Comments:        Anesthesia Quick Evaluation

## 2023-05-28 NOTE — Discharge Instructions (Signed)
EGD Discharge instructions Please read the instructions outlined below and refer to this sheet in the next few weeks. These discharge instructions provide you with general information on caring for yourself after you leave the hospital. Your doctor may also give you specific instructions. While your treatment has been planned according to the most current medical practices available, unavoidable complications occasionally occur. If you have any problems or questions after discharge, please call your doctor. ACTIVITY You may resume your regular activity but move at a slower pace for the next 24 hours.  Take frequent rest periods for the next 24 hours.  Walking will help expel (get rid of) the air and reduce the bloated feeling in your abdomen.  No driving for 24 hours (because of the anesthesia (medicine) used during the test).  You may shower.  Do not sign any important legal documents or operate any machinery for 24 hours (because of the anesthesia used during the test).  NUTRITION Drink plenty of fluids.  You may resume your normal diet.  Begin with a light meal and progress to your normal diet.  Avoid alcoholic beverages for 24 hours or as instructed by your caregiver.  MEDICATIONS You may resume your normal medications unless your caregiver tells you otherwise.  WHAT YOU CAN EXPECT TODAY You may experience abdominal discomfort such as a feeling of fullness or "gas" pains.  FOLLOW-UP Your doctor will discuss the results of your test with you.  SEEK IMMEDIATE MEDICAL ATTENTION IF ANY OF THE FOLLOWING OCCUR: Excessive nausea (feeling sick to your stomach) and/or vomiting.  Severe abdominal pain and distention (swelling).  Trouble swallowing.  Temperature over 101 F (37.8 C).  Rectal bleeding or vomiting of blood.    Your upper endoscopy revealed moderate size hiatal hernia with 2 large Cameron ulcers.  These are friction ulcers related to the hernia itself.  I did take biopsies of the  ulcers in your stomach.  We will call with these results.  Continue on pantoprazole twice daily.  Continue Carafate 4 times daily.  Previously noted esophagitis has healed.  Esophagus appeared normal.  Small bowel appeared normal.  Follow-up in GI office in 6 to 8 weeks.  I hope you have a great rest of your week!  Hennie Duos. Marletta Lor, D.O. Gastroenterology and Hepatology [facility]

## 2023-05-28 NOTE — Op Note (Signed)
[facility] Patient Name: Karen Craig Procedure Date: 05/28/2023 2:22 PM MRN: [id number] Date of Birth: 1966/03/08 Attending MD: Hennie Duos. Marletta Lor , [place], [id number] CSN: [id number] Age: 57 Admit Type: Outpatient Procedure:                Upper GI endoscopy Indications:              Heartburn, Abnormal CT of the GI tract, Nausea with                            vomiting Providers:                Hennie Duos. Marletta Lor, DO, Sheran Fava, Pandora Leiter, Technician Referring MD:             Hennie Duos. Marletta Lor, DO Medicines:                See the Anesthesia note for documentation of the                            administered medications Complications:            No immediate complications. Estimated Blood Loss:     Estimated blood loss was minimal. Procedure:                Pre-Anesthesia Assessment:                           - The anesthesia plan was to use monitored                            anesthesia care (MAC).                           After obtaining informed consent, the endoscope was                            passed under direct vision. Throughout the                            procedure, the patient's blood pressure, pulse, and                            oxygen saturations were monitored continuously. The                            GIF-H190 ([id number]) scope was introduced through the                            mouth, and advanced to the second part of duodenum.                            The upper GI endoscopy was accomplished without                            difficulty.  The patient tolerated the procedure                            well. Scope In: 2:39:18 PM Scope Out: 2:43:39 PM Total Procedure Duration: 0 hours 4 minutes 21 seconds  Findings:      There is no endoscopic evidence of esophagitis at the gastroesophageal       junction.      A medium-sized hiatal hernia with two large Cameron ulcers was found.       Biopsies were taken  with a cold forceps for histology.      Diffuse mild inflammation characterized by erythema was found in the       entire examined stomach. Biopsies were taken with a cold forceps for       Helicobacter pylori testing.      The duodenal bulb, first portion of the duodenum and second portion of       the duodenum were normal. Impression:               - Medium-sized hiatal hernia with two Cameron                            ulcers. Biopsied.                           - Gastritis. Biopsied.                           - Normal duodenal bulb, first portion of the                            duodenum and second portion of the duodenum. Moderate Sedation:      Per Anesthesia Care Recommendation:           - Patient has a contact number available for                            emergencies. The signs and symptoms of potential                            delayed complications were discussed with the                            patient. Return to normal activities tomorrow.                            Written discharge instructions were provided to the                            patient.                           - Resume previous diet.                           - Continue present medications.                           -  Await pathology results.                           - Use a proton pump inhibitor PO BID.                           - Use sucralfate tablets 1 gram PO QID.                           - Return to GI clinic in 6-8 weeks. Procedure Code(s):        --- Professional ---                           [phone], Esophagogastroduodenoscopy, flexible,                            transoral; with biopsy, single or multiple Diagnosis Code(s):        --- Professional ---                           K44.9, Diaphragmatic hernia without obstruction or                            gangrene                           K25.9, Gastric ulcer, unspecified as acute or                            chronic, without hemorrhage  or perforation                           K29.70, Gastritis, unspecified, without bleeding                           R12, Heartburn                           R11.2, Nausea with vomiting, unspecified                           R93.3, Abnormal findings on diagnostic imaging of                            other parts of digestive tract CPT copyright 2022 American Medical Association. All rights reserved. The codes documented in this report are preliminary and upon coder review may  be revised to meet current compliance requirements. Hennie Duos. Marletta Lor, DO Hennie Duos. Marletta Lor, DO 05/28/2023 2:51:19 PM This report has been signed electronically. Number of Addenda: 0

## 2023-05-28 NOTE — Anesthesia Postprocedure Evaluation (Signed)
Anesthesia Post Note  Patient: Karen Craig  Procedure(s) Performed: ESOPHAGOGASTRODUODENOSCOPY (EGD) WITH PROPOFOL BIOPSY  Patient location during evaluation: Phase II Anesthesia Type: General Level of consciousness: awake Pain management: pain level controlled Vital Signs Assessment: post-procedure vital signs reviewed and stable Respiratory status: spontaneous breathing and respiratory function stable Cardiovascular status: blood pressure returned to baseline and stable Postop Assessment: no headache and no apparent nausea or vomiting Anesthetic complications: no Comments: Late entry   No notable events documented.   Last Vitals:  Vitals:   05/28/23 1315 05/28/23 1450  BP: 126/76 (!) 146/63  Pulse: (!) 52 79  Resp: 16 19  Temp:  36.6 C  SpO2: 96% 96%    Last Pain:  Vitals:   05/28/23 1450  TempSrc: Axillary  PainSc: Asleep                 Windell Norfolk

## 2023-05-28 NOTE — Interval H&P Note (Signed)
History and Physical Interval Note:  05/28/2023 2:24 PM  Karen Craig  has presented today for surgery, with the diagnosis of GERD, HIATAL HERNIA, ABN CT.  The various methods of treatment have been discussed with the patient and family. After consideration of risks, benefits and other options for treatment, the patient has consented to  Procedure(s) with comments: ESOPHAGOGASTRODUODENOSCOPY (EGD) WITH PROPOFOL (N/A) - 230pm, asa 3 as a surgical intervention.  The patient's history has been reviewed, patient examined, no change in status, stable for surgery.  I have reviewed the patient's chart and labs.  Questions were answered to the patient's satisfaction.     Lanelle Bal

## 2023-05-28 NOTE — Transfer of Care (Signed)
Immediate Anesthesia Transfer of Care Note  Patient: Karen Craig  Procedure(s) Performed: ESOPHAGOGASTRODUODENOSCOPY (EGD) WITH PROPOFOL BIOPSY  Patient Location: PACU  Anesthesia Type:General  Level of Consciousness: drowsy and patient cooperative  Airway & Oxygen Therapy: Patient Spontanous Breathing  Post-op Assessment: Report given to RN and Post -op Vital signs reviewed and stable  Post vital signs: Reviewed and stable  Last Vitals:  Vitals Value Taken Time  BP 146/63 05/28/23   1450  Temp 36.6 05/28/23   1450  Pulse 79 05/28/23   1450  Resp 19 05/28/23   1450  SpO2 96% 05/28/23   1450    Last Pain:  Vitals:   05/28/23 1432  TempSrc:   PainSc: 0-No pain         Complications: No notable events documented.

## 2023-05-29 NOTE — Addendum Note (Signed)
Addendum  created 05/29/23 0847 by Oletha Cruel, CRNA   Clinical Note Signed

## 2023-05-31 LAB — SURGICAL PATHOLOGY

## 2023-06-01 ENCOUNTER — Encounter (HOSPITAL_COMMUNITY): Payer: Self-pay | Admitting: Internal Medicine

## 2023-06-13 ENCOUNTER — Other Ambulatory Visit: Payer: Self-pay | Admitting: Orthopedic Surgery

## 2023-06-13 DIAGNOSIS — M25572 Pain in left ankle and joints of left foot: Secondary | ICD-10-CM

## 2023-07-16 ENCOUNTER — Other Ambulatory Visit: Payer: Self-pay

## 2023-07-16 DIAGNOSIS — E0590 Thyrotoxicosis, unspecified without thyrotoxic crisis or storm: Secondary | ICD-10-CM

## 2023-07-20 LAB — T4, FREE: Free T4: 1.49 ng/dL (ref 0.82–1.77)

## 2023-07-20 LAB — T3, FREE: T3, Free: 2.5 pg/mL (ref 2.0–4.4)

## 2023-07-20 LAB — TSH: TSH: 0.387 u[IU]/mL — ABNORMAL LOW (ref 0.450–4.500)
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
## 2023-07-26 ENCOUNTER — Ambulatory Visit (INDEPENDENT_AMBULATORY_CARE_PROVIDER_SITE_OTHER): Payer: Medicare Other | Admitting: Gastroenterology

## 2023-07-26 ENCOUNTER — Encounter: Payer: Self-pay | Admitting: Gastroenterology

## 2023-07-26 ENCOUNTER — Encounter: Payer: Self-pay | Admitting: *Deleted

## 2023-07-26 VITALS — BP 134/84 | HR 66 | Temp 98.3°F | Wt 167.0 lb

## 2023-07-26 DIAGNOSIS — D509 Iron deficiency anemia, unspecified: Secondary | ICD-10-CM | POA: Diagnosis not present

## 2023-07-26 DIAGNOSIS — K5900 Constipation, unspecified: Secondary | ICD-10-CM

## 2023-07-26 DIAGNOSIS — K449 Diaphragmatic hernia without obstruction or gangrene: Secondary | ICD-10-CM | POA: Diagnosis not present

## 2023-07-26 DIAGNOSIS — R1011 Right upper quadrant pain: Secondary | ICD-10-CM

## 2023-07-26 DIAGNOSIS — K253 Acute gastric ulcer without hemorrhage or perforation: Secondary | ICD-10-CM | POA: Diagnosis not present

## 2023-07-26 DIAGNOSIS — K769 Liver disease, unspecified: Secondary | ICD-10-CM

## 2023-07-26 DIAGNOSIS — K219 Gastro-esophageal reflux disease without esophagitis: Secondary | ICD-10-CM

## 2023-07-26 NOTE — Patient Instructions (Addendum)
Please have blood work completed to recheck hemoglobin.   We will get you scheduled for an MRI to evaluate your right sided pain and liver lesion.   We will get you scheduled for repeat upper endoscopy in October to follow-up on ulcers and your hiatal hernia.  Continue taking pantoprazole 40 mg twice daily 30 minutes before breakfast and dinner.  Continue taking Carafate 3 times daily before meals and at bedtime.  For constipation, I am going to reach out to [facility] to see what medications you are currently taking.  I will make adjustments after this.  We will follow-up with you in the office after your upper endoscopy.  It was nice to see you today!   Ermalinda Memos, PA-C [facility]

## 2023-07-27 ENCOUNTER — Encounter: Payer: Self-pay | Admitting: *Deleted

## 2023-07-27 ENCOUNTER — Encounter: Payer: Self-pay | Admitting: Gastroenterology

## 2023-07-27 ENCOUNTER — Ambulatory Visit (INDEPENDENT_AMBULATORY_CARE_PROVIDER_SITE_OTHER): Payer: Medicare Other | Admitting: Nurse Practitioner

## 2023-07-27 ENCOUNTER — Encounter: Payer: Self-pay | Admitting: Nurse Practitioner

## 2023-07-27 VITALS — BP 130/89 | HR 66

## 2023-07-27 DIAGNOSIS — E0590 Thyrotoxicosis, unspecified without thyrotoxic crisis or storm: Secondary | ICD-10-CM | POA: Diagnosis not present

## 2023-07-27 DIAGNOSIS — R7989 Other specified abnormal findings of blood chemistry: Secondary | ICD-10-CM | POA: Diagnosis not present

## 2023-07-27 MED ORDER — POLYETHYLENE GLYCOL 3350 17 G PO PACK: 17.0000 g | PACK | Freq: Two times a day (BID) | ORAL | 5 refills | Status: DC
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
## 2023-08-02 LAB — CBC WITH DIFFERENTIAL/PLATELET
Absolute Monocytes: 378 cells/uL (ref 200–950)
Basophils Absolute: 38 cells/uL (ref 0–200)
Basophils Relative: 0.7 %
Eosinophils Absolute: 119 cells/uL (ref 15–500)
Eosinophils Relative: 2.2 %
HCT: 36.2 % (ref 35.0–45.0)
Hemoglobin: 11.5 g/dL — ABNORMAL LOW (ref 11.7–15.5)
Lymphs Abs: 1480 cells/uL (ref 850–3900)
MCH: 29.4 pg (ref 27.0–33.0)
MCHC: 31.8 g/dL — ABNORMAL LOW (ref 32.0–36.0)
MCV: 92.6 fL (ref 80.0–100.0)
MPV: 10.3 fL (ref 7.5–12.5)
Monocytes Relative: 7 %
Neutro Abs: 3386 cells/uL (ref 1500–7800)
Neutrophils Relative %: 62.7 %
Platelets: 287 10*3/uL (ref 140–400)
RBC: 3.91 10*6/uL (ref 3.80–5.10)
RDW: 13.2 % (ref 11.0–15.0)
Total Lymphocyte: 27.4 %
WBC: 5.4 10*3/uL (ref 3.8–10.8)

## 2023-08-03 LAB — IRON,TIBC AND FERRITIN PANEL
%SAT: 17 % (ref 16–45)
Ferritin: 51 ng/mL (ref 16–232)
Iron: 65 ug/dL (ref 45–160)
TIBC: 380 ug/dL (ref 250–450)

## 2023-08-03 LAB — T3, FREE: T3, Free: 2.5 pg/mL (ref 2.0–4.4)

## 2023-08-03 LAB — TSH: TSH: 0.472 u[IU]/mL (ref 0.450–4.500)

## 2023-08-03 LAB — T4, FREE: Free T4: 1.47 ng/dL (ref 0.82–1.77)

## 2023-08-03 NOTE — Progress Notes (Signed)
I think I may just wait til closer to the year mark since they will expire anyway, that way the nursing home doesn't repeat labs too early again

## 2023-08-03 NOTE — Progress Notes (Signed)
Goodness, she did these way too soon.  They were to be done prior to her appt in 1 year.

## 2023-08-06 ENCOUNTER — Telehealth: Payer: Self-pay | Admitting: *Deleted

## 2023-08-06 ENCOUNTER — Ambulatory Visit (HOSPITAL_COMMUNITY): Admission: RE | Admit: 2023-08-06 | Payer: Medicare Other | Source: Ambulatory Visit

## 2023-08-06 NOTE — Telephone Encounter (Signed)
Noted. Please let patient's legal guardian, [name], know this. We will place her on recall for 6 month CT liver protocol which will be in January (6 months from last CT).    [name]: Please place patient on recall for CT liver protocol January 2024.

## 2023-08-06 NOTE — Telephone Encounter (Signed)
Spoke to [employer] at [facility] informed her or recommendations. She voiced understanding. She states she was informed by the hospital already and informed [name] also.

## 2023-08-06 NOTE — Telephone Encounter (Signed)
Noted  
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
## 2023-08-08 NOTE — Telephone Encounter (Signed)
On recall  °

## 2023-08-14 ENCOUNTER — Other Ambulatory Visit: Payer: Self-pay | Admitting: Orthopedic Surgery

## 2023-08-14 DIAGNOSIS — M25572 Pain in left ankle and joints of left foot: Secondary | ICD-10-CM

## 2023-08-28 NOTE — Patient Instructions (Signed)
Krysti Deist Zynda  08/28/2023     @PREFPERIOPPHARMACY @   Your procedure is scheduled on  08/31/2023.   Report to [facility] at  1200 P.M.   Call this number if you have problems the morning of surgery:  [phone]  If you experience any cold or flu symptoms such as cough, fever, chills, shortness of breath, etc. between now and your scheduled surgery, please notify us at the above number.   Remember:      Follow the diet instructions given to you by the office.   You may drink clear liquids until 1000 am on 08/31/2023.      Clear liquids allowed are:                    Water, Carbonated beverages (diabetics please choose diet or no sugar options), Black Coffee Only (No creamer, milk or cream, including half & half and powdered creamer), and Clear Sports drink (No red color; diabetics please choose diet or no sugar options)    Take these medicines the morning of surgery with A SIP OF WATER        pepcid, gabapentin, lorazepam (if needed), meloxicam (if needed), pantoprazole, paroxetine, risperidone.     Do not wear jewelry, make-up or nail polish, including gel polish,  artificial nails, or any other type of covering on natural nails (fingers and  toes).  Do not wear lotions, powders, or perfumes, or deodorant.  Do not shave 48 hours prior to surgery.  Men may shave face and neck.  Do not bring valuables to the hospital.  [facility] is not responsible for any belongings or valuables.  Contacts, dentures or bridgework may not be worn into surgery.  Leave your suitcase in the car.  After surgery it may be brought to your room.  For patients admitted to the hospital, discharge time will be determined by your treatment team.  Patients discharged the day of surgery will not be allowed to drive home and must have someone with them for 24 hours.    Special instructions:   DO NOT some tobacco or vape for 24 hours before your procedure.  Please read over the following  fact sheets that you were given. Anesthesia Post-op Instructions and Care and Recovery After Surgery      Upper Endoscopy, Adult, Care After After the procedure, it is common to have a sore throat. It is also common to have: Mild stomach pain or discomfort. Bloating. Nausea. Follow these instructions at home: The instructions below may help you care for yourself at home. Your health care provider may give you more instructions. If you have questions, ask your health care provider. If you were given a sedative during the procedure, it can affect you for several hours. Do not drive or operate machinery until your health care provider says that it is safe. If you will be going home right after the procedure, plan to have a responsible adult: Take you home from the hospital or clinic. You will not be allowed to drive. Care for you for the time you are told. Follow instructions from your health care provider about what you may eat and drink. Return to your normal activities as told by your health care provider. Ask your health care provider what activities are safe for you. Take over-the-counter and prescription medicines only as told by your health care provider. Contact a health care provider if you: Have a sore throat that lasts  longer than one day. Have trouble swallowing. Have a fever. Get help right away if you: Vomit blood or your vomit looks like coffee grounds. Have bloody, black, or tarry stools. Have a very bad sore throat or you cannot swallow. Have difficulty breathing or very bad pain in your chest or abdomen. These symptoms may be an emergency. Get help right away. Call 911. Do not wait to see if the symptoms will go away. Do not drive yourself to the hospital. Summary After the procedure, it is common to have a sore throat, mild stomach discomfort, bloating, and nausea. If you were given a sedative during the procedure, it can affect you for several hours. Do not drive  until your health care provider says that it is safe. Follow instructions from your health care provider about what you may eat and drink. Return to your normal activities as told by your health care provider. This information is not intended to replace advice given to you by your health care provider. Make sure you discuss any questions you have with your health care provider. Document Revised: 02/01/2022 Document Reviewed: 02/01/2022 Elsevier Patient Education  2024 Else[employer]. Monitored Anesthesia Care, Care After The following information offers guidance on how to care for yourself after your procedure. Your health care provider may also give you more specific instructions. If you have problems or questions, contact your health care provider. What can I expect after the procedure? After the procedure, it is common to have: Tiredness. Little or no memory about what happened during or after the procedure. Impaired judgment when it comes to making decisions. Nausea or vomiting. Some trouble with balance. Follow these instructions at home: For the time period you were told by your health care provider:  Rest. Do not participate in activities where you could fall or become injured. Do not drive or use machinery. Do not drink alcohol. Do not take sleeping pills or medicines that cause drowsiness. Do not make important decisions or sign legal documents. Do not take care of children on your own. Medicines Take over-the-counter and prescription medicines only as told by your health care provider. If you were prescribed antibiotics, take them as told by your health care provider. Do not stop using the antibiotic even if you start to feel better. Eating and drinking Follow instructions from your health care provider about what you may eat and drink. Drink enough fluid to keep your urine pale yellow. If you vomit: Drink clear fluids slowly and in small amounts as you are able. Clear fluids  include water, ice chips, low-calorie sports drinks, and fruit juice that has water added to it (diluted fruit juice). Eat light and bland foods in small amounts as you are able. These foods include bananas, applesauce, rice, lean meats, toast, and crackers. General instructions  Have a responsible adult stay with you for the time you are told. It is important to have someone help care for you until you are awake and alert. If you have sleep apnea, surgery and some medicines can increase your risk for breathing problems. Follow instructions from your health care provider about wearing your sleep device: When you are sleeping. This includes during daytime naps. While taking prescription pain medicines, sleeping medicines, or medicines that make you drowsy. Do not use any products that contain nicotine or tobacco. These products include cigarettes, chewing tobacco, and vaping devices, such as e-cigarettes. If you need help quitting, ask your health care provider. Contact a health care provider if: You  feel nauseous or vomit every time you eat or drink. You feel light-headed. You are still sleepy or having trouble with balance after 24 hours. You get a rash. You have a fever. You have redness or swelling around the IV site. Get help right away if: You have trouble breathing. You have new confusion after you get home. These symptoms may be an emergency. Get help right away. Call 911. Do not wait to see if the symptoms will go away. Do not drive yourself to the hospital. This information is not intended to replace advice given to you by your health care provider. Make sure you discuss any questions you have with your health care provider. Document Revised: 03/20/2022 Document Reviewed: 03/20/2022 Elsevier Patient Education  2024 [employer].

## 2023-08-29 ENCOUNTER — Encounter (HOSPITAL_COMMUNITY)
Admission: RE | Admit: 2023-08-29 | Discharge: 2023-08-29 | Disposition: A | Discharge status: Home / Self Care | Payer: Medicare Other | Source: Ambulatory Visit | Attending: Internal Medicine | Admitting: Internal Medicine

## 2023-08-29 ENCOUNTER — Encounter: Payer: Self-pay | Admitting: Internal Medicine

## 2023-08-29 ENCOUNTER — Encounter (HOSPITAL_COMMUNITY): Payer: Self-pay

## 2023-08-29 VITALS — BP 130/80 | HR 53 | Temp 97.8°F | Resp 18 | Ht 66.0 in | Wt 167.1 lb

## 2023-08-29 DIAGNOSIS — Z01812 Encounter for preprocedural laboratory examination: Secondary | ICD-10-CM | POA: Diagnosis present

## 2023-08-29 DIAGNOSIS — Z79899 Other long term (current) drug therapy: Secondary | ICD-10-CM | POA: Insufficient documentation

## 2023-08-29 LAB — BASIC METABOLIC PANEL
Anion gap: 7 (ref 5–15)
BUN: 16 mg/dL (ref 6–20)
CO2: 28 mmol/L (ref 22–32)
Calcium: 9 mg/dL (ref 8.9–10.3)
Chloride: 105 mmol/L (ref 98–111)
Creatinine, Ser: 0.9 mg/dL (ref 0.44–1.00)
GFR, Estimated: >60 mL/min (ref >60)
Glucose, Bld: 123 mg/dL — ABNORMAL HIGH (ref 70–99)
Potassium: 3 mmol/L — ABNORMAL LOW (ref 3.5–5.1)
Sodium: 140 mmol/L (ref 135–145)

## 2023-08-29 MED ORDER — POTASSIUM CHLORIDE CRYS ER 20 MEQ PO TBCR: 20.0000 meq | EXTENDED_RELEASE_TABLET | Freq: Two times a day (BID) | ORAL | 0 refills | Status: DC

## 2023-08-30 NOTE — Progress Notes (Signed)
Called [facility] nursing facility to inform staff to begin potassium supplement this morning in preparation for patient's procedure tomorrow.  Informed staff of the dosage of 20 meq bid.  Staff will pick up the prescription from [facility] this morning and begin medication administration.

## 2023-08-30 NOTE — Progress Notes (Signed)
Received phone call from Armando Gang.  Patient's potassium level was 3.0.  French Ana notified Dr. Marletta Lor who sent in a prescription for potassium 20 meq bid to her pharmacy.

## 2023-08-31 ENCOUNTER — Ambulatory Visit (HOSPITAL_COMMUNITY)
Admission: RE | Admit: 2023-08-31 | Discharge: 2023-08-31 | Disposition: A | Discharge status: Skilled Nursing Facility | Payer: Medicare Other | Attending: Internal Medicine | Admitting: Internal Medicine

## 2023-08-31 ENCOUNTER — Encounter (HOSPITAL_COMMUNITY): Payer: Self-pay

## 2023-08-31 ENCOUNTER — Ambulatory Visit (HOSPITAL_COMMUNITY): Payer: Medicare Other | Admitting: Certified Registered Nurse Anesthetist

## 2023-08-31 ENCOUNTER — Other Ambulatory Visit: Payer: Self-pay

## 2023-08-31 ENCOUNTER — Encounter (HOSPITAL_COMMUNITY)
Admission: RE | Disposition: A | Discharge status: Skilled Nursing Facility | Payer: Self-pay | Source: Home / Self Care | Attending: Internal Medicine

## 2023-08-31 DIAGNOSIS — K449 Diaphragmatic hernia without obstruction or gangrene: Secondary | ICD-10-CM

## 2023-08-31 DIAGNOSIS — I5032 Chronic diastolic (congestive) heart failure: Secondary | ICD-10-CM | POA: Insufficient documentation

## 2023-08-31 DIAGNOSIS — F17210 Nicotine dependence, cigarettes, uncomplicated: Secondary | ICD-10-CM | POA: Diagnosis not present

## 2023-08-31 DIAGNOSIS — F419 Anxiety disorder, unspecified: Secondary | ICD-10-CM | POA: Diagnosis not present

## 2023-08-31 DIAGNOSIS — F0390 Unspecified dementia without behavioral disturbance: Secondary | ICD-10-CM | POA: Diagnosis not present

## 2023-08-31 DIAGNOSIS — K219 Gastro-esophageal reflux disease without esophagitis: Secondary | ICD-10-CM | POA: Insufficient documentation

## 2023-08-31 DIAGNOSIS — F319 Bipolar disorder, unspecified: Secondary | ICD-10-CM | POA: Diagnosis not present

## 2023-08-31 DIAGNOSIS — I69969 Other paralytic syndrome following unspecified cerebrovascular disease affecting unspecified side: Secondary | ICD-10-CM | POA: Diagnosis not present

## 2023-08-31 DIAGNOSIS — R1011 Right upper quadrant pain: Secondary | ICD-10-CM | POA: Diagnosis present

## 2023-08-31 DIAGNOSIS — J449 Chronic obstructive pulmonary disease, unspecified: Secondary | ICD-10-CM | POA: Insufficient documentation

## 2023-08-31 DIAGNOSIS — I110 Hypertensive heart disease with heart failure: Secondary | ICD-10-CM | POA: Diagnosis not present

## 2023-08-31 DIAGNOSIS — K2289 Other specified disease of esophagus: Secondary | ICD-10-CM | POA: Diagnosis not present

## 2023-08-31 HISTORY — PX: ESOPHAGOGASTRODUODENOSCOPY (EGD) WITH PROPOFOL: SHX5813

## 2023-08-31 HISTORY — PX: BIOPSY: SHX5522

## 2023-08-31 LAB — POCT I-STAT, CHEM 8
BUN: 23 mg/dL — ABNORMAL HIGH (ref 6–20)
Calcium, Ion: 1.06 mmol/L — ABNORMAL LOW (ref 1.15–1.40)
Chloride: 115 mmol/L — ABNORMAL HIGH (ref 98–111)
Creatinine, Ser: 0.9 mg/dL (ref 0.44–1.00)
Glucose, Bld: 74 mg/dL (ref 70–99)
HCT: 36 % (ref 36.0–46.0)
Hemoglobin: 12.2 g/dL (ref 12.0–15.0)
Potassium: 6.1 mmol/L — ABNORMAL HIGH (ref 3.5–5.1)
Sodium: 143 mmol/L (ref 135–145)
TCO2: 24 mmol/L (ref 22–32)

## 2023-08-31 LAB — POTASSIUM: Potassium: 3.9 mmol/L (ref 3.5–5.1)

## 2023-08-31 SURGERY — ESOPHAGOGASTRODUODENOSCOPY (EGD) WITH PROPOFOL
Anesthesia: General

## 2023-08-31 MED ORDER — PROPOFOL 10 MG/ML IV BOLUS
INTRAVENOUS | Status: DC | PRN
Start: 2023-08-31 — End: 2023-08-31
  Administered 2023-08-31: 40 mg via INTRAVENOUS
  Administered 2023-08-31: 80 mg via INTRAVENOUS

## 2023-08-31 MED ORDER — LACTATED RINGERS IV SOLN: INTRAVENOUS | Status: DC | PRN

## 2023-08-31 MED ORDER — STERILE WATER FOR IRRIGATION IR SOLN
Status: DC | PRN
  Administered 2023-08-31: 60 mL

## 2023-08-31 MED ORDER — LIDOCAINE HCL (PF) 2 % IJ SOLN
INTRAMUSCULAR | Status: DC | PRN
  Administered 2023-08-31: 40 mg via INTRADERMAL

## 2023-08-31 NOTE — Transfer of Care (Signed)
Immediate Anesthesia Transfer of Care Note  Patient: Karen Craig  Procedure(s) Performed: ESOPHAGOGASTRODUODENOSCOPY (EGD) WITH PROPOFOL BIOPSY  Patient Location: Short Stay  Anesthesia Type:General  Level of Consciousness: drowsy  Airway & Oxygen Therapy: Patient Spontanous Breathing  Post-op Assessment: Report given to RN and Post -op Vital signs reviewed and stable  Post vital signs: Reviewed and stable  Last Vitals:  Vitals Value Taken Time  BP 145/91   Temp    Pulse 51   Resp 14   SpO2 100%     Last Pain:  Vitals:   08/31/23 1344  TempSrc:   PainSc: 0-No pain      Patients Stated Pain Goal: 6 (08/31/23 1238)  Complications: No notable events documented.

## 2023-08-31 NOTE — Discharge Instructions (Addendum)
EGD Discharge instructions Please read the instructions outlined below and refer to this sheet in the next few weeks. These discharge instructions provide you with general information on caring for yourself after you leave the hospital. Your doctor may also give you specific instructions. While your treatment has been planned according to the most current medical practices available, unavoidable complications occasionally occur. If you have any problems or questions after discharge, please call your doctor. ACTIVITY You may resume your regular activity but move at a slower pace for the next 24 hours.  Take frequent rest periods for the next 24 hours.  Walking will help expel (get rid of) the air and reduce the bloated feeling in your abdomen.  No driving for 24 hours (because of the anesthesia (medicine) used during the test).  You may shower.  Do not sign any important legal documents or operate any machinery for 24 hours (because of the anesthesia used during the test).  NUTRITION Drink plenty of fluids.  You may resume your normal diet.  Begin with a light meal and progress to your normal diet.  Avoid alcoholic beverages for 24 hours or as instructed by your caregiver.  MEDICATIONS You may resume your normal medications unless your caregiver tells you otherwise.  WHAT YOU CAN EXPECT TODAY You may experience abdominal discomfort such as a feeling of fullness or "gas" pains.  FOLLOW-UP Your doctor will discuss the results of your test with you.  SEEK IMMEDIATE MEDICAL ATTENTION IF ANY OF THE FOLLOWING OCCUR: Excessive nausea (feeling sick to your stomach) and/or vomiting.  Severe abdominal pain and distention (swelling).  Trouble swallowing.  Temperature over 101 F (37.8 C).  Rectal bleeding or vomiting of blood.    Overall, everything looks much improved today.  Previously noted large ulcers have completely healed.  I did take samples of your esophagus to screen for Barrett's  esophagus.  We will call with these results.  Continue current medications.  Follow-up in GI office in 3 months.  I hope you have a great rest of your week!  Hennie Duos. Marletta Lor, D.O. Gastroenterology and Hepatology [facility]

## 2023-08-31 NOTE — Anesthesia Postprocedure Evaluation (Signed)
Anesthesia Post Note  Patient: Karen Craig  Procedure(s) Performed: ESOPHAGOGASTRODUODENOSCOPY (EGD) WITH PROPOFOL BIOPSY  Patient location during evaluation: PACU Anesthesia Type: General Level of consciousness: awake and alert Pain management: pain level controlled Vital Signs Assessment: post-procedure vital signs reviewed and stable Respiratory status: spontaneous breathing, nonlabored ventilation, respiratory function stable and patient connected to nasal cannula oxygen Cardiovascular status: blood pressure returned to baseline and stable Postop Assessment: no apparent nausea or vomiting Anesthetic complications: no   There were no known notable events for this encounter.   Last Vitals:  Vitals:   08/31/23 1238 08/31/23 1403  BP: (!) 147/99 (!) 145/91  Pulse: (!) 47 (!) 52  Resp: 14 13  Temp: 36.8 C (!) 36.3 C  SpO2: 99% 100%    Last Pain:  Vitals:   08/31/23 1403  TempSrc: Oral  PainSc: 0-No pain                 Rashema Seawright L Analea Muller
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
## 2023-08-31 NOTE — Op Note (Signed)
[facility] Patient Name: Karen Craig Procedure Date: 08/31/2023 1:03 PM MRN: [id number] Date of Birth: November 24, 1965 Attending MD: Hennie Duos. Marletta Lor , [place], [id number] CSN: [id number] Age: 57 Admit Type: Outpatient Procedure:                Upper GI endoscopy Indications:              Abdominal pain in the right upper quadrant,                            Heartburn, Follow-up of hiatal hernia Providers:                Hennie Duos. Marletta Lor, DO, Kristopher Glee, RN, Pandora Leiter, Technician, Lennice Sites Technician,                            Technician Referring MD:              Medicines:                See the Anesthesia note for documentation of the                            administered medications Complications:            No immediate complications. Estimated Blood Loss:     Estimated blood loss was minimal. Procedure:                Pre-Anesthesia Assessment:                           - The anesthesia plan was to use monitored                            anesthesia care (MAC).                           After obtaining informed consent, the endoscope was                            passed under direct vision. Throughout the                            procedure, the patient's blood pressure, pulse, and                            oxygen saturations were monitored continuously. The                            [id number]-H190 ([id number]) scope was introduced through the                            mouth, and advanced to the second part of duodenum.                            The upper GI endoscopy  was accomplished without                            difficulty. The patient tolerated the procedure                            well. Scope In: 1:50:22 PM Scope Out: 1:55:36 PM Total Procedure Duration: 0 hours 5 minutes 14 seconds  Findings:      The Z-line was irregular. Biopsies were taken with a cold forceps for       histology to screen for Barrett's      A  medium-sized hiatal hernia was present. Previously noted Cameron       Ulcers have comepletely healed.      The duodenal bulb, first portion of the duodenum and second portion of       the duodenum were normal. Impression:               - Z-line irregular. Biopsied.                           - Medium-sized hiatal hernia.                           - Normal duodenal bulb, first portion of the                            duodenum and second portion of the duodenum. Moderate Sedation:      Per Anesthesia Care Recommendation:           - Patient has a contact number available for                            emergencies. The signs and symptoms of potential                            delayed complications were discussed with the                            patient. Return to normal activities tomorrow.                            Written discharge instructions were provided to the                            patient.                           - Resume previous diet.                           - Continue present medications.                           - Await pathology results.                           - Return to GI clinic in 3 months. Procedure Code(s):        ---  Professional ---                           [id number], Esophagogastroduodenoscopy, flexible,                            transoral; with biopsy, single or multiple Diagnosis Code(s):        --- Professional ---                           K22.89, Other specified disease of esophagus                           K44.9, Diaphragmatic hernia without obstruction or                            gangrene                           R10.11, Right upper quadrant pain                           R12, Heartburn CPT copyright 2022 American Medical Association. All rights reserved. The codes documented in this report are preliminary and upon coder review may  be revised to meet current compliance requirements. Hennie Duos. Marletta Lor, DO Hennie Duos. Marletta Lor,  DO 08/31/2023 1:59:52 PM This report has been signed electronically. Number of Addenda: 0
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
## 2023-09-03 LAB — SURGICAL PATHOLOGY

## 2023-09-04 ENCOUNTER — Telehealth: Payer: Self-pay | Admitting: *Deleted

## 2023-09-04 NOTE — Telephone Encounter (Signed)
At last visit, recommended sucralfate scheduled, 3 times daily before meals and at bedtime.

## 2023-09-04 NOTE — Telephone Encounter (Signed)
House manger Selena Batten) called and needs verification if sucralfate is as needed or on a daily basis 4 times daily.

## 2023-09-05 ENCOUNTER — Other Ambulatory Visit: Payer: Self-pay | Admitting: Gastroenterology

## 2023-09-05 MED ORDER — SUCRALFATE 1 G PO TABS: 1.0000 g | ORAL_TABLET | Freq: Three times a day (TID) | ORAL | 1 refills | Status: DC

## 2023-09-05 NOTE — Telephone Encounter (Signed)
Prescription sent electronically

## 2023-09-05 NOTE — Telephone Encounter (Signed)
Noted. Informed facility

## 2023-09-05 NOTE — Telephone Encounter (Signed)
Spoke to house manger and she needs a prescription for the sucralfate 3 times daily.

## 2023-09-06 ENCOUNTER — Encounter (HOSPITAL_COMMUNITY): Payer: Self-pay | Admitting: Internal Medicine

## 2023-11-16 ENCOUNTER — Other Ambulatory Visit: Payer: Self-pay | Admitting: Orthopedic Surgery

## 2023-11-16 DIAGNOSIS — M25572 Pain in left ankle and joints of left foot: Secondary | ICD-10-CM

## 2024-01-13 ENCOUNTER — Emergency Department (HOSPITAL_COMMUNITY)
Admission: EM | Admit: 2024-01-13 | Discharge: 2024-01-14 | Disposition: A | Discharge status: Skilled Nursing Facility | Attending: Emergency Medicine | Admitting: Emergency Medicine

## 2024-01-13 ENCOUNTER — Other Ambulatory Visit: Payer: Self-pay

## 2024-01-13 ENCOUNTER — Emergency Department (HOSPITAL_COMMUNITY)

## 2024-01-13 DIAGNOSIS — E876 Hypokalemia: Secondary | ICD-10-CM | POA: Insufficient documentation

## 2024-01-13 DIAGNOSIS — W19XXXA Unspecified fall, initial encounter: Secondary | ICD-10-CM | POA: Insufficient documentation

## 2024-01-13 DIAGNOSIS — S0990XA Unspecified injury of head, initial encounter: Secondary | ICD-10-CM | POA: Diagnosis present

## 2024-01-13 DIAGNOSIS — K529 Noninfective gastroenteritis and colitis, unspecified: Secondary | ICD-10-CM | POA: Insufficient documentation

## 2024-01-13 DIAGNOSIS — I5030 Unspecified diastolic (congestive) heart failure: Secondary | ICD-10-CM | POA: Insufficient documentation

## 2024-01-13 DIAGNOSIS — Z7982 Long term (current) use of aspirin: Secondary | ICD-10-CM | POA: Diagnosis not present

## 2024-01-13 LAB — CBC WITH DIFFERENTIAL/PLATELET
Abs Immature Granulocytes: 0.02 10*3/uL (ref 0.00–0.07)
Basophils Absolute: 0 10*3/uL (ref 0.0–0.1)
Basophils Relative: 0 %
Eosinophils Absolute: 0.1 10*3/uL (ref 0.0–0.5)
Eosinophils Relative: 1 %
HCT: 37.9 % (ref 36.0–46.0)
Hemoglobin: 12.2 g/dL (ref 12.0–15.0)
Immature Granulocytes: 0 %
Lymphocytes Relative: 22 %
Lymphs Abs: 1.2 10*3/uL (ref 0.7–4.0)
MCH: 29.1 pg (ref 26.0–34.0)
MCHC: 32.2 g/dL (ref 30.0–36.0)
MCV: 90.5 fL (ref 80.0–100.0)
Monocytes Absolute: 0.4 10*3/uL (ref 0.1–1.0)
Monocytes Relative: 7 %
Neutro Abs: 3.7 10*3/uL (ref 1.7–7.7)
Neutrophils Relative %: 70 %
Platelets: 217 10*3/uL (ref 150–400)
RBC: 4.19 MIL/uL (ref 3.87–5.11)
RDW: 15 % (ref 11.5–15.5)
WBC: 5.3 10*3/uL (ref 4.0–10.5)
nRBC: 0 % (ref 0.0–0.2)

## 2024-01-13 LAB — COMPREHENSIVE METABOLIC PANEL
ALT: 38 U/L (ref 0–44)
AST: 66 U/L — ABNORMAL HIGH (ref 15–41)
Albumin: 3.5 g/dL (ref 3.5–5.0)
Alkaline Phosphatase: 43 U/L (ref 38–126)
Anion gap: 11 (ref 5–15)
BUN: 17 mg/dL (ref 6–20)
CO2: 24 mmol/L (ref 22–32)
Calcium: 9.5 mg/dL (ref 8.9–10.3)
Chloride: 107 mmol/L (ref 98–111)
Creatinine, Ser: 0.81 mg/dL (ref 0.44–1.00)
GFR, Estimated: >60 mL/min (ref >60)
Glucose, Bld: 75 mg/dL (ref 70–99)
Potassium: 3.3 mmol/L — ABNORMAL LOW (ref 3.5–5.1)
Sodium: 142 mmol/L (ref 135–145)
Total Bilirubin: 0.9 mg/dL (ref 0.0–1.2)
Total Protein: 6.8 g/dL (ref 6.5–8.1)

## 2024-01-13 LAB — URINALYSIS, ROUTINE W REFLEX MICROSCOPIC
Bacteria, UA: NONE SEEN
Bilirubin Urine: NEGATIVE
Glucose, UA: NEGATIVE mg/dL
Hgb urine dipstick: NEGATIVE
Ketones, ur: NEGATIVE mg/dL
Nitrite: NEGATIVE
Protein, ur: NEGATIVE mg/dL
Specific Gravity, Urine: 1.009 (ref 1.005–1.030)
pH: 6 (ref 5.0–8.0)

## 2024-01-13 LAB — LIPASE, BLOOD: Lipase: 27 U/L (ref 11–51)

## 2024-01-13 MED ORDER — LACTATED RINGERS IV BOLUS
1000.0000 mL | Freq: Once | INTRAVENOUS | Status: AC
  Administered 2024-01-13: 1000 mL via INTRAVENOUS

## 2024-01-13 MED ORDER — ONDANSETRON HCL 4 MG/2ML IJ SOLN
4.0000 mg | Freq: Once | INTRAMUSCULAR | Status: AC
  Administered 2024-01-13: 4 mg via INTRAVENOUS
  Filled 2024-01-13: qty 2

## 2024-01-13 MED ORDER — POTASSIUM CHLORIDE CRYS ER 20 MEQ PO TBCR
40.0000 meq | EXTENDED_RELEASE_TABLET | Freq: Once | ORAL | Status: AC
  Administered 2024-01-13: 40 meq via ORAL
  Filled 2024-01-13: qty 2

## 2024-01-13 MED ORDER — ONDANSETRON 4 MG PO TBDP: 4.0000 mg | ORAL_TABLET | Freq: Three times a day (TID) | ORAL | 0 refills | Status: AC | PRN

## 2024-01-13 NOTE — ED Triage Notes (Signed)
 Pt arrived [facility] from [facility] with c/o weakness, vomiting and a fall today . Pt fell d/t weakness and hit her head.

## 2024-01-13 NOTE — Discharge Instructions (Signed)
 You were seen in the ER today for concerns of nausea and vomiting as well as a fall. Your labs and imaging were thankfully reassuring today and you appear to have responded well to nausea medication. I have sent this medication to your pharmacy. Please continue to use this for nausea to try to stay as hydrated as possible. Return to the ER for any concerns of new or worsening symptoms.

## 2024-01-13 NOTE — ED Notes (Signed)
 Left side restricted

## 2024-01-13 NOTE — ED Triage Notes (Signed)
 [facility] stated this facility has norovirus in the building.

## 2024-01-13 NOTE — ED Provider Notes (Signed)
 [facility] AT [facility] Provider Note   CSN: [id number] Arrival date & time: 01/13/24  1326     History Chief Complaint  Patient presents with   Fatigue   Fall    Karen Craig is a 58 y.o. female.  Patient past history significant for schizophrenia, paralytic syndrome, CVA, diastolic heart failure presents ED with concerns of fatigue and a fall.  Patient reportedly has had several days of weakness with associated vomiting.  States that she had a mechanical fall today and she struck her head due to feeling weak.  Patient is on aspirin.  Denies any feelings of headaches, dizziness, or lightheadedness at this time.  There is reportedly a norovirus outbreak at the her current care facility.   Fall       Home Medications Prior to Admission medications   Medication Sig Start Date End Date Taking? Authorizing Provider  ondansetron (ZOFRAN-ODT) 4 MG disintegrating tablet Take 1 tablet (4 mg total) by mouth every 8 (eight) hours as needed for nausea or vomiting. 01/13/24  Yes Smitty Knudsen, PA-C  acetaminophen (TYLENOL) 650 MG CR tablet Take 650 mg by mouth every 8 (eight) hours as needed (back pain).    [provider]  aspirin EC 81 MG tablet Take 81 mg by mouth daily. Swallow whole.    [provider]  carboxymethylcellulose (REFRESH PLUS) 0.5 % SOLN Apply 1 drop to eye in the morning and at bedtime. Refresh Classic    [provider]  Diclofenac Sodium 3 % GEL Apply 1 gram (1 inch = 1 gram) to the affected area 3 times daily 11/19/23   Vickki Hearing, MD  famotidine (PEPCID) 20 MG tablet Take 20 mg by mouth daily. 08/06/20   [provider]  fenofibrate (TRICOR) 145 MG tablet Take 145 mg by mouth daily. 08/09/20   [provider]  ferrous sulfate 325 (65 FE) MG tablet Take 325 mg by mouth daily with breakfast.    [provider]  fluticasone (FLONASE) 50 MCG/ACT nasal spray Place 2 sprays into  both nostrils daily. 08/04/20   [provider]  furosemide (LASIX) 20 MG tablet Take 20 mg by mouth daily.    [provider]  gabapentin (NEURONTIN) 100 MG capsule Take 100 mg by mouth 3 (three) times daily.    [provider]  LORazepam (ATIVAN) 0.5 MG tablet Take 1 tablet (0.5 mg total) by mouth 2 (two) times daily. 09/09/20   Shon Hale, MD  Meloxicam 5 MG CAPS Take 1 capsule by mouth daily. 01/09/23   [provider]  Multiple Vitamins-Minerals (CERTAVITE SENIOR/ANTIOXIDANT PO) Take 1 tablet by mouth daily.     [provider]  oxybutynin (DITROPAN-XL) 5 MG 24 hr tablet Take 5 mg by mouth daily. 08/06/20   [provider]  pantoprazole (PROTONIX) 40 MG tablet Take 1 tablet (40 mg total) by mouth 2 (two) times daily before a meal. 09/09/20   Emokpae, Courage, MD  PARoxetine (PAXIL) 30 MG tablet Take 30 mg by mouth daily.    [provider]  polyethylene glycol (MIRALAX / GLYCOLAX) 17 g packet Take 17 g by mouth 2 (two) times daily. 07/27/23   Letta Median, PA-C  potassium chloride (KLOR-CON) 10 MEQ tablet Take 10 mEq by mouth daily.    [provider]  potassium chloride SA (KLOR-CON M) 20 MEQ tablet Take 1 tablet (20 mEq total) by mouth 2 (two) times daily for 2 days.  08/29/23 08/31/23  Lanelle Bal, DO  risperiDONE (RISPERDAL) 2 MG tablet Take 2 mg by mouth 2 (two) times daily.    [provider]  sennosides-docusate sodium (SENOKOT-S) 8.6-50 MG tablet Take 2 tablets by mouth at bedtime.    [provider]  sucralfate (CARAFATE) 1 g tablet Take 1 tablet (1 g total) by mouth 4 (four) times daily -  with meals and at bedtime. 09/05/23   Letta Median, PA-C  Vitamin D, Ergocalciferol, (DRISDOL) 1.25 MG (50000 UNIT) CAPS capsule Take 1 capsule (50,000 Units total) by mouth every 7 (seven) days. 09/15/20   Shon Hale, MD  Vitamins A & D (VITAMIN A & D) ointment Apply 1 application topically  as needed for dry skin.    [provider]      Allergies    5-alpha reductase inhibitors    Review of Systems   Review of Systems  Constitutional:  Positive for fatigue.  All other systems reviewed and are negative.   Physical Exam Updated Vital Signs BP (!) 129/58   Pulse (!) 47   Temp 97.9 F (36.6 C) (Oral)   Resp 15   Ht 5\' 7"  (1.702 m)   Wt 77.1 kg   SpO2 97%   BMI 26.63 kg/m  Physical Exam Vitals and nursing note reviewed.  Constitutional:      General: She is not in acute distress.    Appearance: She is well-developed.  HENT:     Head: Normocephalic and atraumatic.   Eyes:     Conjunctiva/sclera: Conjunctivae normal.  Cardiovascular:     Rate and Rhythm: Normal rate and regular rhythm.     Heart sounds: No murmur heard. Pulmonary:     Effort: Pulmonary effort is normal. No respiratory distress.     Breath sounds: Normal breath sounds.  Abdominal:     Palpations: Abdomen is soft.     Tenderness: There is no abdominal tenderness.  Musculoskeletal:        General: No swelling.     Cervical back: Neck supple.  Skin:    General: Skin is warm and dry.     Capillary Refill: Capillary refill takes less than 2 seconds.  Neurological:     Mental Status: She is alert.     Comments: Pupils are PERRL.  Psychiatric:        Mood and Affect: Mood normal.     ED Results / Procedures / Treatments   Labs (all labs ordered are listed, but only abnormal results are displayed) Labs Reviewed  COMPREHENSIVE METABOLIC PANEL - Abnormal; Notable for the following components:      Result Value   Potassium 3.3 (*)    AST 66 (*)    All other components within normal limits  URINALYSIS, ROUTINE W REFLEX MICROSCOPIC - Abnormal; Notable for the following components:   APPearance HAZY (*)    Leukocytes,Ua TRACE (*)    All other components within normal limits  CBC WITH DIFFERENTIAL/PLATELET  LIPASE, BLOOD    EKG None  Radiology CT Head Wo Contrast Result  Date: 01/13/2024 CLINICAL DATA:  Weakness, fall EXAM: CT HEAD WITHOUT CONTRAST CT CERVICAL SPINE WITHOUT CONTRAST TECHNIQUE: Multidetector CT imaging of the head and cervical spine was performed following the standard protocol without intravenous contrast. Multiplanar CT image reconstructions of the cervical spine were also generated. RADIATION DOSE REDUCTION: This exam was performed according to the departmental dose-optimization program which includes automated exposure control, adjustment of the mA and/or kV according  to patient size and/or use of iterative reconstruction technique. COMPARISON:  10/08/2021 FINDINGS: CT HEAD FINDINGS Brain: No evidence of acute infarction, hemorrhage, hydrocephalus, extra-axial collection or mass lesion/mass effect. Unchanged encephalomalacia involving the posterior right hemisphere in the medial posterior left hemisphere. Vascular: No hyperdense vessel or unexpected calcification. Skull: Status post posterior right hemicraniectomy with implant reconstruction. Negative for fracture or focal lesion. Sinuses/Orbits: No acute finding. Other: None. CT CERVICAL SPINE FINDINGS Alignment: Normal. Skull base and vertebrae: No acute fracture. No primary bone lesion or focal pathologic process. Soft tissues and spinal canal: No prevertebral fluid or swelling. No visible canal hematoma. Disc levels:  Mild multilevel cervical disc degenerative disease. Upper chest: Negative. Other: None. IMPRESSION: 1. No acute intracranial pathology. 2. Unchanged encephalomalacia involving the posterior right hemisphere in the medial posterior left hemisphere. 3. Status post posterior right hemicraniectomy with implant reconstruction. 4. No fracture or static subluxation of the cervical spine. 5. Mild multilevel cervical disc degenerative disease. Electronically Signed   By: Jearld Lesch M.D.   On: 01/13/2024 15:31   CT Cervical Spine Wo Contrast Result Date: 01/13/2024 CLINICAL DATA:  Weakness, fall EXAM:  CT HEAD WITHOUT CONTRAST CT CERVICAL SPINE WITHOUT CONTRAST TECHNIQUE: Multidetector CT imaging of the head and cervical spine was performed following the standard protocol without intravenous contrast. Multiplanar CT image reconstructions of the cervical spine were also generated. RADIATION DOSE REDUCTION: This exam was performed according to the departmental dose-optimization program which includes automated exposure control, adjustment of the mA and/or kV according to patient size and/or use of iterative reconstruction technique. COMPARISON:  10/08/2021 FINDINGS: CT HEAD FINDINGS Brain: No evidence of acute infarction, hemorrhage, hydrocephalus, extra-axial collection or mass lesion/mass effect. Unchanged encephalomalacia involving the posterior right hemisphere in the medial posterior left hemisphere. Vascular: No hyperdense vessel or unexpected calcification. Skull: Status post posterior right hemicraniectomy with implant reconstruction. Negative for fracture or focal lesion. Sinuses/Orbits: No acute finding. Other: None. CT CERVICAL SPINE FINDINGS Alignment: Normal. Skull base and vertebrae: No acute fracture. No primary bone lesion or focal pathologic process. Soft tissues and spinal canal: No prevertebral fluid or swelling. No visible canal hematoma. Disc levels:  Mild multilevel cervical disc degenerative disease. Upper chest: Negative. Other: None. IMPRESSION: 1. No acute intracranial pathology. 2. Unchanged encephalomalacia involving the posterior right hemisphere in the medial posterior left hemisphere. 3. Status post posterior right hemicraniectomy with implant reconstruction. 4. No fracture or static subluxation of the cervical spine. 5. Mild multilevel cervical disc degenerative disease. Electronically Signed   By: Jearld Lesch M.D.   On: 01/13/2024 15:31    Procedures Procedures    Medications Ordered in ED Medications  lactated ringers bolus 1,000 mL (1,000 mLs Intravenous Bolus 01/13/24  1449)  ondansetron (ZOFRAN) injection 4 mg (4 mg Intravenous Given 01/13/24 1449)  potassium chloride SA (KLOR-CON M) CR tablet 40 mEq (40 mEq Oral Given 01/13/24 1753)    ED Course/ Medical Decision Making/ A&P                                 Medical Decision Making Amount and/or Complexity of Data Reviewed Labs: ordered. Radiology: ordered.  Risk Prescription drug management.   This patient presents to the ED for concern of vomiting, mechanical fall.  Differential diagnosis includes SAH, head injury, norovirus, gastritis   Lab Tests:  I Ordered, and personally interpreted labs.  The pertinent results include: CBC unremarkable, CMP with mild hypokalemia 3.3,  UA with trace leukocytes but no other acute signs of infection, lipase unremarkable   Imaging Studies ordered:  I ordered imaging studies including CT head, CT cervical spine I independently visualized and interpreted imaging which showed negative for any acute findings on head or cervical spine imaging I agree with the radiologist interpretation   Medicines ordered and prescription drug management:  I ordered medication including Zofran, fluid, potassium for vomiting, dehydration, hypokalemia Reevaluation of the patient after these medicines showed that the patient improved I have reviewed the patients home medicines and have made adjustments as needed   Problem List / ED Course:  Patient with past history significant for schizophrenia, paralytic syndrome, CVA, diastolic heart failure presents ED with concerns of fatigue and a fall.  States that she has had multiple days of worsening fatigue with multiple episodes of vomiting.  EMS reports that there is a door virus outbreak at her current care facility.  Patient herself is able to state that she is having numerous episodes of vomiting and some diarrhea but the diarrhea is improving.  Denies any hematochezia, or hematemesis.  No other acute concerns that patient is  reporting. Physical exam unremarkable.  No abdominal tenderness.  Patient has unremarkable neurological assessment.  Pupils are PERRL.  Doubt traumatic head injury but the setting of a acute fall but patient cannot recall well, obtain CT imaging of the head and cervical spine to assess for any possible trauma.  Fluids and nausea medicine administered for symptom control. Patient lab workup unremarkable.  Minimal dehydration with mild hypokalemia seen likely due to GI loss.  Will replete with p.o. potassium. On reassessment, patient's condition appears to have improved.  No other acute or focal concerns at this time.  Will discharge patient home with antiemetic medications.  Return precautions discussed.  Patient otherwise stable and discharged home for outpatient follow-up.   Social Determinants of Health:  Resident of care facility  Final Clinical Impression(s) / ED Diagnoses Final diagnoses:  Gastroenteritis  Hypokalemia    Rx / DC Orders ED Discharge Orders          Ordered    ondansetron (ZOFRAN-ODT) 4 MG disintegrating tablet  Every 8 hours PRN        01/13/24 1752              Smitty Knudsen, PA-C 01/13/24 1754    Terrilee Files, MD 01/15/24 1114

## 2024-01-13 NOTE — ED Notes (Signed)
 Patient transported to CT

## 2024-01-31 ENCOUNTER — Observation Stay (HOSPITAL_COMMUNITY)
Admission: EM | Admit: 2024-01-31 | Discharge: 2024-02-01 | Disposition: A | Discharge status: Home Health | Attending: Family Medicine | Admitting: Family Medicine

## 2024-01-31 ENCOUNTER — Emergency Department (HOSPITAL_COMMUNITY)

## 2024-01-31 ENCOUNTER — Other Ambulatory Visit: Payer: Self-pay

## 2024-01-31 ENCOUNTER — Encounter (HOSPITAL_COMMUNITY): Payer: Self-pay | Admitting: Family Medicine

## 2024-01-31 DIAGNOSIS — Z7982 Long term (current) use of aspirin: Secondary | ICD-10-CM | POA: Insufficient documentation

## 2024-01-31 DIAGNOSIS — N3281 Overactive bladder: Secondary | ICD-10-CM | POA: Diagnosis present

## 2024-01-31 DIAGNOSIS — Z8673 Personal history of transient ischemic attack (TIA), and cerebral infarction without residual deficits: Secondary | ICD-10-CM | POA: Insufficient documentation

## 2024-01-31 DIAGNOSIS — R001 Bradycardia, unspecified: Secondary | ICD-10-CM | POA: Insufficient documentation

## 2024-01-31 DIAGNOSIS — R55 Syncope and collapse: Principal | ICD-10-CM

## 2024-01-31 DIAGNOSIS — F17210 Nicotine dependence, cigarettes, uncomplicated: Secondary | ICD-10-CM | POA: Diagnosis not present

## 2024-01-31 DIAGNOSIS — S06300A Unspecified focal traumatic brain injury without loss of consciousness, initial encounter: Secondary | ICD-10-CM | POA: Insufficient documentation

## 2024-01-31 DIAGNOSIS — D509 Iron deficiency anemia, unspecified: Secondary | ICD-10-CM | POA: Insufficient documentation

## 2024-01-31 DIAGNOSIS — E785 Hyperlipidemia, unspecified: Secondary | ICD-10-CM | POA: Insufficient documentation

## 2024-01-31 DIAGNOSIS — F209 Schizophrenia, unspecified: Secondary | ICD-10-CM | POA: Diagnosis present

## 2024-01-31 DIAGNOSIS — F411 Generalized anxiety disorder: Secondary | ICD-10-CM | POA: Diagnosis not present

## 2024-01-31 DIAGNOSIS — I10 Essential (primary) hypertension: Secondary | ICD-10-CM | POA: Diagnosis present

## 2024-01-31 DIAGNOSIS — S0081XA Abrasion of other part of head, initial encounter: Secondary | ICD-10-CM

## 2024-01-31 DIAGNOSIS — W19XXXA Unspecified fall, initial encounter: Secondary | ICD-10-CM | POA: Diagnosis not present

## 2024-01-31 DIAGNOSIS — I5030 Unspecified diastolic (congestive) heart failure: Secondary | ICD-10-CM | POA: Diagnosis present

## 2024-01-31 DIAGNOSIS — Z79899 Other long term (current) drug therapy: Secondary | ICD-10-CM | POA: Diagnosis not present

## 2024-01-31 DIAGNOSIS — I110 Hypertensive heart disease with heart failure: Secondary | ICD-10-CM | POA: Insufficient documentation

## 2024-01-31 DIAGNOSIS — J449 Chronic obstructive pulmonary disease, unspecified: Secondary | ICD-10-CM | POA: Diagnosis present

## 2024-01-31 DIAGNOSIS — Z23 Encounter for immunization: Secondary | ICD-10-CM | POA: Insufficient documentation

## 2024-01-31 DIAGNOSIS — F0390 Unspecified dementia without behavioral disturbance: Secondary | ICD-10-CM | POA: Diagnosis not present

## 2024-01-31 DIAGNOSIS — S069XAA Unspecified intracranial injury with loss of consciousness status unknown, initial encounter: Secondary | ICD-10-CM | POA: Diagnosis present

## 2024-01-31 DIAGNOSIS — S0083XA Contusion of other part of head, initial encounter: Secondary | ICD-10-CM

## 2024-01-31 LAB — T4, FREE: Free T4: 1.08 ng/dL (ref 0.61–1.12)

## 2024-01-31 LAB — COMPREHENSIVE METABOLIC PANEL WITH GFR
ALT: 17 U/L (ref 0–44)
AST: 25 U/L (ref 15–41)
Albumin: 3.8 g/dL (ref 3.5–5.0)
Alkaline Phosphatase: 47 U/L (ref 38–126)
Anion gap: 10 (ref 5–15)
BUN: 15 mg/dL (ref 6–20)
CO2: 27 mmol/L (ref 22–32)
Calcium: 9.5 mg/dL (ref 8.9–10.3)
Chloride: 106 mmol/L (ref 98–111)
Creatinine, Ser: 0.79 mg/dL (ref 0.44–1.00)
GFR, Estimated: >60 mL/min (ref >60)
Glucose, Bld: 102 mg/dL — ABNORMAL HIGH (ref 70–99)
Potassium: 3.6 mmol/L (ref 3.5–5.1)
Sodium: 143 mmol/L (ref 135–145)
Total Bilirubin: 0.4 mg/dL (ref 0.0–1.2)
Total Protein: 7.3 g/dL (ref 6.5–8.1)

## 2024-01-31 LAB — CBC WITH DIFFERENTIAL/PLATELET
Abs Immature Granulocytes: 0.02 10*3/uL (ref 0.00–0.07)
Basophils Absolute: 0 10*3/uL (ref 0.0–0.1)
Basophils Relative: 1 %
Eosinophils Absolute: 0 10*3/uL (ref 0.0–0.5)
Eosinophils Relative: 0 %
HCT: 38.8 % (ref 36.0–46.0)
Hemoglobin: 12.5 g/dL (ref 12.0–15.0)
Immature Granulocytes: 0 %
Lymphocytes Relative: 17 %
Lymphs Abs: 0.9 10*3/uL (ref 0.7–4.0)
MCH: 29.1 pg (ref 26.0–34.0)
MCHC: 32.2 g/dL (ref 30.0–36.0)
MCV: 90.4 fL (ref 80.0–100.0)
Monocytes Absolute: 0.2 10*3/uL (ref 0.1–1.0)
Monocytes Relative: 4 %
Neutro Abs: 4 10*3/uL (ref 1.7–7.7)
Neutrophils Relative %: 78 %
Platelets: 280 10*3/uL (ref 150–400)
RBC: 4.29 MIL/uL (ref 3.87–5.11)
RDW: 15.3 % (ref 11.5–15.5)
WBC: 5.1 10*3/uL (ref 4.0–10.5)
nRBC: 0 % (ref 0.0–0.2)

## 2024-01-31 LAB — ETHANOL: Alcohol, Ethyl (B): <10 mg/dL (ref <10)

## 2024-01-31 LAB — TSH: TSH: 0.357 u[IU]/mL (ref 0.350–4.500)

## 2024-01-31 LAB — URINALYSIS, ROUTINE W REFLEX MICROSCOPIC
Bilirubin Urine: NEGATIVE
Glucose, UA: NEGATIVE mg/dL
Hgb urine dipstick: NEGATIVE
Ketones, ur: NEGATIVE mg/dL
Leukocytes,Ua: NEGATIVE
Nitrite: NEGATIVE
Protein, ur: NEGATIVE mg/dL
Specific Gravity, Urine: 1.009 (ref 1.005–1.030)
pH: 7 (ref 5.0–8.0)

## 2024-01-31 LAB — TROPONIN I (HIGH SENSITIVITY)
Troponin I (High Sensitivity): 5 ng/L (ref <18)
Troponin I (High Sensitivity): 5 ng/L (ref <18)

## 2024-01-31 MED ORDER — POLYVINYL ALCOHOL 1.4 % OP SOLN: 1.0000 [drp] | Freq: Two times a day (BID) | OPHTHALMIC | Status: DC | PRN

## 2024-01-31 MED ORDER — FUROSEMIDE 20 MG PO TABS
20.0000 mg | ORAL_TABLET | Freq: Every day | ORAL | Status: DC
  Administered 2024-02-01: 20 mg via ORAL
  Filled 2024-01-31: qty 1

## 2024-01-31 MED ORDER — FERROUS SULFATE 325 (65 FE) MG PO TABS
325.0000 mg | ORAL_TABLET | Freq: Every day | ORAL | Status: DC
  Administered 2024-02-01: 325 mg via ORAL
  Filled 2024-01-31: qty 1

## 2024-01-31 MED ORDER — ENOXAPARIN SODIUM 40 MG/0.4ML IJ SOSY
40.0000 mg | PREFILLED_SYRINGE | INTRAMUSCULAR | Status: DC
  Administered 2024-01-31 – 2024-02-01 (×2): 40 mg via SUBCUTANEOUS
  Filled 2024-01-31 (×2): qty 0.4

## 2024-01-31 MED ORDER — PANTOPRAZOLE SODIUM 40 MG PO TBEC
40.0000 mg | DELAYED_RELEASE_TABLET | Freq: Two times a day (BID) | ORAL | Status: DC
  Administered 2024-02-01 (×2): 40 mg via ORAL
  Filled 2024-01-31 (×2): qty 1

## 2024-01-31 MED ORDER — GABAPENTIN 100 MG PO CAPS
100.0000 mg | ORAL_CAPSULE | Freq: Three times a day (TID) | ORAL | Status: DC
  Administered 2024-01-31 – 2024-02-01 (×4): 100 mg via ORAL
  Filled 2024-01-31 (×4): qty 1

## 2024-01-31 MED ORDER — ASPIRIN 81 MG PO TBEC
81.0000 mg | DELAYED_RELEASE_TABLET | Freq: Every day | ORAL | Status: DC
  Administered 2024-02-01: 81 mg via ORAL
  Filled 2024-01-31: qty 1

## 2024-01-31 MED ORDER — PAROXETINE HCL 20 MG PO TABS
30.0000 mg | ORAL_TABLET | Freq: Every day | ORAL | Status: DC
  Administered 2024-02-01: 30 mg via ORAL
  Filled 2024-01-31: qty 1

## 2024-01-31 MED ORDER — DOUBLE ANTIBIOTIC 500-10000 UNIT/GM EX OINT
TOPICAL_OINTMENT | Freq: Once | CUTANEOUS | Status: AC
  Filled 2024-01-31: qty 1

## 2024-01-31 MED ORDER — LORAZEPAM 0.5 MG PO TABS
0.5000 mg | ORAL_TABLET | Freq: Two times a day (BID) | ORAL | Status: DC
  Administered 2024-01-31 – 2024-02-01 (×3): 0.5 mg via ORAL
  Filled 2024-01-31 (×3): qty 1

## 2024-01-31 MED ORDER — SENNOSIDES-DOCUSATE SODIUM 8.6-50 MG PO TABS
2.0000 | ORAL_TABLET | Freq: Every day | ORAL | Status: DC
  Administered 2024-01-31 – 2024-02-01 (×2): 2 via ORAL
  Filled 2024-01-31 (×2): qty 2

## 2024-01-31 MED ORDER — NICOTINE 21 MG/24HR TD PT24
21.0000 mg | MEDICATED_PATCH | Freq: Every day | TRANSDERMAL | Status: DC
  Filled 2024-01-31: qty 1

## 2024-01-31 MED ORDER — ACETAMINOPHEN 325 MG PO TABS
650.0000 mg | ORAL_TABLET | Freq: Three times a day (TID) | ORAL | Status: DC | PRN
  Administered 2024-02-01: 650 mg via ORAL
  Filled 2024-01-31: qty 2

## 2024-01-31 MED ORDER — TETANUS-DIPHTH-ACELL PERTUSSIS 5-2.5-18.5 LF-MCG/0.5 IM SUSY
0.5000 mL | PREFILLED_SYRINGE | Freq: Once | INTRAMUSCULAR | Status: AC
  Administered 2024-01-31: 0.5 mL via INTRAMUSCULAR
  Filled 2024-01-31: qty 0.5

## 2024-01-31 MED ORDER — ONDANSETRON 4 MG PO TBDP: 4.0000 mg | ORAL_TABLET | Freq: Three times a day (TID) | ORAL | Status: DC | PRN

## 2024-01-31 MED ORDER — POLYETHYLENE GLYCOL 3350 17 G PO PACK
17.0000 g | PACK | Freq: Two times a day (BID) | ORAL | Status: DC
Start: 2024-01-31 — End: 2024-02-02
  Administered 2024-02-01 (×2): 17 g via ORAL
  Filled 2024-01-31 (×3): qty 1

## 2024-01-31 MED ORDER — CYCLOSPORINE 0.05 % OP EMUL
1.0000 [drp] | Freq: Two times a day (BID) | OPHTHALMIC | Status: DC
  Administered 2024-01-31 – 2024-02-01 (×3): 1 [drp] via OPHTHALMIC
  Filled 2024-01-31 (×3): qty 30

## 2024-01-31 MED ORDER — RISPERIDONE 1 MG PO TABS
3.0000 mg | ORAL_TABLET | Freq: Two times a day (BID) | ORAL | Status: DC
  Administered 2024-01-31 – 2024-02-01 (×3): 3 mg via ORAL
  Filled 2024-01-31 (×3): qty 3

## 2024-01-31 MED ORDER — OXYBUTYNIN CHLORIDE ER 5 MG PO TB24
5.0000 mg | ORAL_TABLET | Freq: Every day | ORAL | Status: DC
  Administered 2024-02-01: 5 mg via ORAL
  Filled 2024-01-31: qty 1

## 2024-01-31 MED ORDER — FLUTICASONE PROPIONATE 50 MCG/ACT NA SUSP
2.0000 | Freq: Every day | NASAL | Status: DC
  Administered 2024-02-01: 2 via NASAL
  Filled 2024-01-31: qty 16

## 2024-01-31 MED ORDER — ALBUTEROL SULFATE (2.5 MG/3ML) 0.083% IN NEBU: 2.5000 mg | INHALATION_SOLUTION | RESPIRATORY_TRACT | Status: DC | PRN

## 2024-01-31 NOTE — ED Notes (Signed)
 Legal Guardian requested PT referral for pt before d/c. Attending aware.

## 2024-01-31 NOTE — ED Notes (Signed)
 Pt verbalized she hears a baby crying inside her at night. Pt unable to sign MSE.
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
## 2024-01-31 NOTE — ED Notes (Signed)
 Attempted to call legal guardian to give an update but only received the voicemail.

## 2024-01-31 NOTE — ED Notes (Signed)
 Transport staff informed to transport pt to 300.

## 2024-01-31 NOTE — Care Management Obs Status (Signed)
 MEDICARE OBSERVATION STATUS NOTIFICATION   Patient Details  Name: Karen Craig MRN: [id number] Date of Birth: February 28, 1966   Medicare Observation Status Notification Given:  Yes    Barron Alvine, RN 01/31/2024, 3:40 PM

## 2024-01-31 NOTE — ED Provider Notes (Signed)
 [place] EMERGENCY DEPARTMENT AT [facility] Provider Note   CSN: [id number] Arrival date & time: 01/31/24  0900     History  Chief Complaint  Patient presents with   Karen Craig is a 58 y.o. female.  Patient is a 58 year old female who presents to the emergency department with a chief complaint of a syncopal event and a fall from her wheelchair this morning.  Patient notes that she did strike her head during the fall and is currently complaining of a headache, pain to her neck and back, pain to her left arm and left leg.  She denies any numbness or paresthesias.  She denies any chest pain or abdominal pain.  She has had no associated nausea, vomiting, diarrhea.  She denies any active dizziness or lightheadedness.   Fall Associated symptoms include headaches.       Home Medications Prior to Admission medications   Medication Sig Start Date End Date Taking? Authorizing Provider  acetaminophen (TYLENOL) 650 MG CR tablet Take 650 mg by mouth every 8 (eight) hours as needed (back pain).    [provider]  aspirin EC 81 MG tablet Take 81 mg by mouth daily. Swallow whole.    [provider]  carboxymethylcellulose (REFRESH PLUS) 0.5 % SOLN Apply 1 drop to eye in the morning and at bedtime. Refresh Classic    [provider]  Diclofenac Sodium 3 % GEL Apply 1 gram (1 inch = 1 gram) to the affected area 3 times daily 11/19/23   Vickki Hearing, MD  famotidine (PEPCID) 20 MG tablet Take 20 mg by mouth daily. 08/06/20   [provider]  fenofibrate (TRICOR) 145 MG tablet Take 145 mg by mouth daily. 08/09/20   [provider]  ferrous sulfate 325 (65 FE) MG tablet Take 325 mg by mouth daily with breakfast.    [provider]  fluticasone (FLONASE) 50 MCG/ACT nasal spray Place 2 sprays into both nostrils daily. 08/04/20   [provider]  furosemide (LASIX) 20 MG tablet Take 20 mg by mouth daily.     [provider]  gabapentin (NEURONTIN) 100 MG capsule Take 100 mg by mouth 3 (three) times daily.    [provider]  LORazepam (ATIVAN) 0.5 MG tablet Take 1 tablet (0.5 mg total) by mouth 2 (two) times daily. 09/09/20   Shon Hale, MD  Meloxicam 5 MG CAPS Take 1 capsule by mouth daily. 01/09/23   [provider]  Multiple Vitamins-Minerals (CERTAVITE SENIOR/ANTIOXIDANT PO) Take 1 tablet by mouth daily.     [provider]  ondansetron (ZOFRAN-ODT) 4 MG disintegrating tablet Take 1 tablet (4 mg total) by mouth every 8 (eight) hours as needed for nausea or vomiting. 01/13/24   Smitty Knudsen, PA-C  oxybutynin (DITROPAN-XL) 5 MG 24 hr tablet Take 5 mg by mouth daily. 08/06/20   [provider]  pantoprazole (PROTONIX) 40 MG tablet Take 1 tablet (40 mg total) by mouth 2 (two) times daily before a meal. 09/09/20   Emokpae, Courage, MD  PARoxetine (PAXIL) 30 MG tablet Take 30 mg by mouth daily.    [provider]  polyethylene glycol (MIRALAX / GLYCOLAX) 17 g packet Take 17 g by mouth 2 (two) times daily. 07/27/23   Letta Median, PA-C  potassium chloride (KLOR-CON) 10 MEQ tablet Take 10 mEq by mouth daily.    [provider]  potassium chloride SA (KLOR-CON M) 20 MEQ tablet Take  1 tablet (20 mEq total) by mouth 2 (two) times daily for 2 days. 08/29/23 08/31/23  Lanelle Bal, DO  risperiDONE (RISPERDAL) 2 MG tablet Take 2 mg by mouth 2 (two) times daily.    [provider]  sennosides-docusate sodium (SENOKOT-S) 8.6-50 MG tablet Take 2 tablets by mouth at bedtime.    [provider]  sucralfate (CARAFATE) 1 g tablet Take 1 tablet (1 g total) by mouth 4 (four) times daily -  with meals and at bedtime. 09/05/23   Letta Median, PA-C  Vitamin D, Ergocalciferol, (DRISDOL) 1.25 MG (50000 UNIT) CAPS capsule Take 1 capsule (50,000 Units total) by mouth every 7 (seven) days. 09/15/20   Shon Hale, MD  Vitamins A &  D (VITAMIN A & D) ointment Apply 1 application topically as needed for dry skin.    [provider]      Allergies    5-alpha reductase inhibitors    Review of Systems   Review of Systems  Musculoskeletal:        Neck pain, back pain, pain to left arm and left leg  Neurological:  Positive for syncope and headaches.  All other systems reviewed and are negative.   Physical Exam Updated Vital Signs BP (!) 157/77   Pulse (!) 54   Temp 98 F (36.7 C) (Oral)   Resp 20   Ht 5\' 7"  (1.702 m)   Wt 86.2 kg   SpO2 95%   BMI 29.76 kg/m  Physical Exam Vitals and nursing note reviewed.  Constitutional:      General: She is not in acute distress.    Appearance: Normal appearance. She is not ill-appearing.  HENT:     Head: Normocephalic.     Nose: Nose normal.     Mouth/Throat:     Mouth: Mucous membranes are moist.  Eyes:     Extraocular Movements: Extraocular movements intact.     Conjunctiva/sclera: Conjunctivae normal.     Pupils: Pupils are equal, round, and reactive to light.  Cardiovascular:     Rate and Rhythm: Normal rate and regular rhythm.     Pulses: Normal pulses.     Heart sounds: Normal heart sounds. No murmur heard.    No gallop.  Pulmonary:     Effort: Pulmonary effort is normal. No respiratory distress.     Breath sounds: Normal breath sounds. No stridor. No wheezing or rales.  Abdominal:     General: Abdomen is flat. Bowel sounds are normal. There is no distension.     Palpations: Abdomen is soft.     Tenderness: There is no abdominal tenderness. There is no guarding.  Musculoskeletal:     Cervical back: Normal range of motion and neck supple. Tenderness present. No rigidity.     Comments: Tenderness palpation noted over the left arm diffusely, edema noted over the left elbow, radial pulse 2+ upper extremities, nontender palpation of the right upper extremity diffusely, full range of motion noted to right upper extremity, limited active and passive  range of motion at left elbow and left wrist, sensation intact distally Tenderness palpation noted over the left ankle, left hip, nontender palpation of the left knee or foot, nontender palpation of the right lower extremity diffusely, DP and PT pulses are 2+ distally, sensation intact distally, pelvis stable to AP and lateral compression, full range of motion noted to bilateral lower extremities, no obvious deformity Mild tenderness palpation over thoracic and lumbar spine, no step-off or deformity  Skin:  General: Skin is warm and dry.     Comments: Abrasion noted over right side of face and forehead  Neurological:     General: No focal deficit present.     Mental Status: She is alert and oriented to person, place, and time. Mental status is at baseline.     Cranial Nerves: No cranial nerve deficit.     Sensory: No sensory deficit.     Motor: No weakness.     Coordination: Coordination normal.  Psychiatric:        Mood and Affect: Mood normal.        Behavior: Behavior normal.        Thought Content: Thought content normal.        Judgment: Judgment normal.     ED Results / Procedures / Treatments   Labs (all labs ordered are listed, but only abnormal results are displayed) Labs Reviewed  COMPREHENSIVE METABOLIC PANEL WITH GFR  CBC WITH DIFFERENTIAL/PLATELET  ETHANOL  URINALYSIS, ROUTINE W REFLEX MICROSCOPIC  TROPONIN I (HIGH SENSITIVITY)    EKG EKG Interpretation Date/Time:  Thursday January 31 2024 09:21:45 EDT Ventricular Rate:  58 PR Interval:  143 QRS Duration:  124 QT Interval:  419 QTC Calculation: 412 R Axis:   82  Text Interpretation: Sinus rhythm Nonspecific intraventricular conduction delay Confirmed by Beckey Downing [phone]) on 01/31/2024 9:25:01 AM  Radiology No results found.  Procedures Procedures    Medications Ordered in ED Medications  Tdap (BOOSTRIX) injection 0.5 mL (has no administration in time range)    ED Course/ Medical Decision Making/  A&P                                 Medical Decision Making Amount and/or Complexity of Data Reviewed Labs: ordered. Radiology: ordered. ECG/medicine tests: ordered.  Risk OTC drugs. Prescription drug management. Decision regarding hospitalization.   This patient presents to the ED for concern of facial pain, pain to left upper and lower extremity, syncope, this involves an extensive number of treatment options, and is a complaint that carries with it a high risk of complications and morbidity.  The differential diagnosis includes vasovagal syncope, CVA, TIA, ACS, pulmonary embolus, long bone or joint fracture, intracranial hemorrhage, electrolyte derangement   Co morbidities that complicate the patient evaluation  Previous TBI, schizophrenia, dementia   Additional history obtained:  Additional history obtained from medical records External records from outside source obtained and reviewed including none   Lab Tests:  I Ordered, and personally interpreted labs.  The pertinent results include: Negative troponin, unremarkable urinalysis, normal kidney function, liver function, normal electrolytes, no leukocytosis, no anemia   Imaging Studies ordered:  I ordered imaging studies including CT scan of head, cervical spine, maxillofacial, x-ray of left ankle, hip, humerus, forearm, hand I independently visualized and interpreted imaging which showed no acute intracranial hemorrhage, no facial fracture, no vertebral fracture, no long bone or joint fracture I agree with the radiologist interpretation   Cardiac Monitoring: / EKG:  The patient was maintained on a cardiac monitor.  I personally viewed and interpreted the cardiac monitored which showed an underlying rhythm of: Sinus bradycardia, no ST/T wave changes, no ischemic changes, no STEMI   Consultations Obtained:  I requested consultation with the hospitalist,  and discussed lab and imaging findings as well as pertinent  plan - they recommend: Admission   Problem List / ED Course / Critical interventions / Medication  management  Patient does remain stable at this time.  Discussed with patient we will plan for admission to the hospital service given her syncopal event of unexplained source at this time.  All imaging Emergency Department has been unremarkable.  Blood work has been unremarkable as well.  EKG does demonstrate sinus bradycardia and she has remained bradycardic in the emergency department though does have a history of this.  Tetanus shot was updated and wounds were dressed.  Patient has no lacerations that warrant suturing at this time.  Did discuss patient case with Dr. Jarvis Newcomer with the hospitalist service who has excepted for admission at this time. I ordered medication including tetanus, bacitracin for fall, abrasion Reevaluation of the patient after these medicines showed that the patient improved I have reviewed the patients home medicines and have made adjustments as needed   Social Determinants of Health:  Lives at assisted living facility   Test / Admission - Considered:  Admission        Final Clinical Impression(s) / ED Diagnoses Final diagnoses:  None    Rx / DC Orders ED Discharge Orders     None         Lelon Perla, PA-C 01/31/24 1347    Durwin Glaze, MD 02/01/24 1506

## 2024-01-31 NOTE — Progress Notes (Signed)
   01/31/24 1547  TOC Brief Assessment  Insurance and Status Reviewed  Patient has primary care physician Yes  Home environment has been reviewed [facility]. Legal guardian, [name] [phone]) at [employer].  Prior level of function: Assisted.  Social Drivers of Health Review SDOH reviewed no interventions necessary  Readmission risk has been reviewed Yes  Transition of care needs no transition of care needs at this time   Transition of Care Department [facility]) has reviewed patient and no other TOC needs have been identified at this time. We will continue to monitor patient advancement through interdisciplinary progression rounds. If new patient needs arise, please place a TOC consult.

## 2024-01-31 NOTE — ED Triage Notes (Signed)
 Pt arrived [facility] from [facility] . Pt was on porch smoking and fell from w/c. Pt has an abrasion and swollen to right eyebrow and cheek area. Pt denies vomiting but states she passed out and was on the ground for 20 mins. Pt also stated her left arm was broken yesterday but did not get it looked at because staff said they would get in trouble. Pt also verbalized her left leg was cut with a chainsaw and it was put back together but is still sore. Pt has TBI and Schizophrenia.

## 2024-01-31 NOTE — Plan of Care (Signed)
   Problem: Education: Goal: Knowledge of General Education information will improve Description Including pain rating scale, medication(s)/side effects and non-pharmacologic comfort measures Outcome: Progressing   Problem: Health Behavior/Discharge Planning: Goal: Ability to manage health-related needs will improve Outcome: Progressing

## 2024-02-01 ENCOUNTER — Observation Stay (HOSPITAL_COMMUNITY)

## 2024-02-01 DIAGNOSIS — R55 Syncope and collapse: Secondary | ICD-10-CM | POA: Diagnosis not present

## 2024-02-01 LAB — ECHOCARDIOGRAM COMPLETE
AR max vel: 2.22 cm2
AV Area VTI: 2.35 cm2
AV Area mean vel: 2.17 cm2
AV Mean grad: 4 mmHg
AV Peak grad: 7.2 mmHg
Ao pk vel: 1.34 m/s
Area-P 1/2: 3.74 cm2
Height: 67 in
S' Lateral: 3.6 cm
Weight: 3040 [oz_av]

## 2024-02-01 LAB — T3, FREE: T3, Free: 2.7 pg/mL (ref 2.0–4.4)

## 2024-02-01 LAB — HIV ANTIBODY (ROUTINE TESTING W REFLEX): HIV Screen 4th Generation wRfx: NONREACTIVE

## 2024-02-01 MED ORDER — PERFLUTREN LIPID MICROSPHERE
1.0000 mL | INTRAVENOUS | Status: AC | PRN
  Administered 2024-02-01: 3 mL via INTRAVENOUS

## 2024-02-01 NOTE — Plan of Care (Signed)
  Problem: Acute Rehab PT Goals(only PT should resolve) Goal: Pt Will Go Supine/Side To Sit Outcome: Progressing Flowsheets (Taken 02/01/2024 1245) Pt will go Supine/Side to Sit: with minimal assist Goal: Patient Will Perform Sitting Balance Outcome: Progressing Flowsheets (Taken 02/01/2024 1245) Patient will perform sitting balance: with contact guard assist Goal: Patient Will Transfer Sit To/From Stand Outcome: Progressing Flowsheets (Taken 02/01/2024 1245) Patient will transfer sit to/from stand: with minimal assist Goal: Pt Will Transfer Bed To Chair/Chair To Bed Outcome: Progressing Flowsheets (Taken 02/01/2024 1245) Pt will Transfer Bed to Chair/Chair to Bed: with mod assist   12:46 PM, 02/01/24 Chryl Heck, PT, DPT [place] with [facility]

## 2024-02-01 NOTE — Discharge Summary (Signed)
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 Emergency Use Authorization (EUA). This EUA will remain in effect (meaning this test can be used) for the duration of the COVID-19 declaration under Section 564(b)(1) of the Act, 21 U.S.C. section 360bbb-3(b)(1), unless the authorization is terminated or revoked.  Performed at [facility], [address] [place], [place] [address]     Labs: CBC: Recent Labs  Lab 01/31/24 1141  WBC 5.1  NEUTROABS 4.0  HGB 12.5  HCT 38.8  MCV 90.4  PLT 280   Basic Metabolic Panel: Recent Labs  Lab 01/31/24 1141  NA 143  K 3.6  CL 106  CO2 27  GLUCOSE 102*  BUN 15  CREATININE 0.79  CALCIUM 9.5   Liver Function Tests: Recent Labs  Lab 01/31/24 1141  AST 25  ALT 17  ALKPHOS 47  BILITOT 0.4  PROT 7.3  ALBUMIN 3.8   CBG: No results for input(s): "GLUCAP" in the last 168 hours.  Discharge time spent: greater than 30 minutes.  Signed: Tyrone Nine, MD [facility] 02/01/2024

## 2024-02-01 NOTE — Progress Notes (Signed)
  Echocardiogram 2D Echocardiogram has been performed.  Karen Craig 02/01/2024, 1:56 PM

## 2024-02-01 NOTE — Plan of Care (Signed)
  Problem: Acute Rehab OT Goals (only OT should resolve) Goal: Pt. Will Perform Grooming Flowsheets (Taken 02/01/2024 1247) Pt Will Perform Grooming:  with min assist  sitting Goal: Pt. Will Perform Upper Body Dressing Flowsheets (Taken 02/01/2024 1247) Pt Will Perform Upper Body Dressing:  with min assist  sitting Goal: Pt/Caregiver Will Perform Home Exercise Program Flowsheets (Taken 02/01/2024 1247) Pt/caregiver will Perform Home Exercise Program:  Increased ROM  Increased strength  Right Upper extremity  Left upper extremity  With minimal assist  Jerardo Costabile OT, MOT

## 2024-02-01 NOTE — Plan of Care (Signed)
  Problem: Education: Goal: Knowledge of General Education information will improve Description: Including pain rating scale, medication(s)/side effects and non-pharmacologic comfort measures 02/01/2024 0005 by Peggye Form, RN Outcome: Progressing 02/01/2024 0005 by Peggye Form, RN Outcome: Progressing   Problem: Health Behavior/Discharge Planning: Goal: Ability to manage health-related needs will improve 02/01/2024 0005 by Peggye Form, RN Outcome: Progressing 02/01/2024 0005 by Peggye Form, RN Outcome: Progressing   Problem: Clinical Measurements: Goal: Ability to maintain clinical measurements within normal limits will improve 02/01/2024 0005 by Peggye Form, RN Outcome: Progressing 02/01/2024 0005 by Peggye Form, RN Outcome: Progressing

## 2024-02-01 NOTE — TOC Initial Note (Signed)
 Transition of Care [facility]) - Initial/Assessment Note    Patient Details  Name: Karen Craig MRN: [id number] Date of Birth: Nov 04, 1966  Transition of Care [facility]) CM/SW Contact:    Elliot Gault, LCSW Phone Number: 02/01/2024, 12:47 PM  Clinical Narrative:                   Pt here observation status from [facility]. Per MD, pt stable for dc today. PT/OT recommending HH at dc.  Spoke with pt's legal guardian, Karen Craig, to update on dc. Karen Craig  agreeable to [facility] being arranged with [facility] and she is agreeable to EMS transport.  Updated Trudy at [facility]. Karen Craig prefers [facility] with [facility]. Updated Karen Craig at [facility] and they will follow pt at the ALF.   DC clinical sent electronically. EMS will be arranged once echo read and dc finalized by MD. No other TOC needs for dc.   Expected Discharge Plan: Assisted Living Barriers to Discharge: Barriers Resolved   Patient Goals and CMS Choice Patient states their goals for this hospitalization and ongoing recovery are:: get better CMS Medicare.gov Compare Post Acute Care list provided to:: Legal Guardian Choice offered to / list presented to : [facility] POA / Guardian      Expected Discharge Plan and Services In-house Referral: Clinical Social Work   Post Acute Care Choice: Home Health Living arrangements for the past 2 months: Assisted Living Facility                           HH Arranged: PT, OT [facility] Agency: [employer] [facility] Date [facility] Agency Contacted: 02/01/24   Representative spoke with at [facility] Agency: Karen Craig  Prior Living Arrangements/Services Living arrangements for the past 2 months: Assisted Living Facility Lives with:: Facility Resident Patient language and need for interpreter reviewed:: Yes Do you feel safe going back to the place where you live?: Yes      Need for Family Participation in Patient Care: No (Comment) Care giver support system in place?: Yes (comment)   Criminal Activity/Legal Involvement  Pertinent to Current Situation/Hospitalization: No - Comment as needed  Activities of Daily Living   ADL Screening (condition at time of admission) Independently performs ADLs?: No Does the patient have a NEW difficulty with bathing/dressing/toileting/self-feeding that is expected to last >3 days?: No Does the patient have a NEW difficulty with getting in/out of bed, walking, or climbing stairs that is expected to last >3 days?: No Does the patient have a NEW difficulty with communication that is expected to last >3 days?: No Is the patient deaf or have difficulty hearing?: No Does the patient have difficulty seeing, even when wearing glasses/contacts?: No Does the patient have difficulty concentrating, remembering, or making decisions?: Yes  Permission Sought/Granted Permission sought to share information with : Oceanographer granted to share information with : Yes, Verbal Permission Granted     Permission granted to share info w AGENCY: HH        Emotional Assessment         Alcohol / Substance Use: Not Applicable Psych Involvement: No (comment)  Admission diagnosis:  Syncope and collapse [R55] Syncope [R55] Contusion of face, initial encounter [S00.83XA] Abrasion of face, initial encounter [S00.81XA] Patient Active Problem List   Diagnosis Date Noted   Syncope 01/31/2024   Ankylosis, left ankle 01/05/2023   Chronic diastolic (congestive) heart failure (HCC) 01/04/2023   Unspecified dementia, unspecified severity, with anxiety (HCC) 01/04/2023  Hyperlipidemia, unspecified 01/04/2023   Paralytic syndrome, unspecified (HCC) 01/04/2023   Overactive bladder 01/04/2023   Personal history of traumatic brain injury 01/04/2023   Hypertensive heart disease with heart failure (HCC) 01/04/2023   Vitamin D deficiency 09/07/2020   Vitamin B12 deficiency 09/07/2020   TBI (traumatic brain injury) (HCC) 09/07/2020   Intellectual disability 09/07/2020    Symptomatic anemia 09/06/2020   Schizophrenia (HCC)    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HCC)    OAB (overactive bladder)    HTN (hypertension)    COPD (chronic obstructive pulmonary disease) (HCC)    Generalized anxiety disorder    CVA (cerebral vascular accident) (HCC)    Diastolic heart failure (HCC)    OA (osteoarthritis)    Memory loss    Dementia (HCC)    Hypokalemia    Rectal bleeding    Hemorrhoids    PCP:  No primary care provider on file. Pharmacy:   [facility] [place], [place] - [address] [place] [place] [address] Phone: [phone] Fax: [phone]     Social Drivers of Health (SDOH) Social History: SDOH Screenings   Food Insecurity: No Food Insecurity (01/31/2024)  Housing: Low Risk  (01/31/2024)  Transportation Needs: No Transportation Needs (01/31/2024)  Utilities: Not At Risk (01/31/2024)  Tobacco Use: High Risk (01/31/2024)   SDOH Interventions:     Readmission Risk Interventions     No data to display

## 2024-02-01 NOTE — [place] FL2 (Signed)
 [place] MEDICAID FL2 LEVEL OF CARE FORM     IDENTIFICATION  Patient Name: Karen Craig Birthdate: Aug 29, 1966 Sex: female Admission Date (Current Location): 01/31/2024  [facility] and [place] Number:  [employer] and Address:  [facility],  [address] [place] [address]      Provider Number: [id number]  Attending Physician Name and Address:  Tyrone Nine, MD  Relative Name and Phone Number:       Current Level of Care: Hospital Recommended Level of Care: Assisted Living Facility Prior Approval Number:    Date Approved/Denied:   PASRR Number:    Discharge Plan: Other (Comment) (ALF)    Current Diagnoses: Patient Active Problem List   Diagnosis Date Noted   Syncope 01/31/2024   Ankylosis, left ankle 01/05/2023   Chronic diastolic (congestive) heart failure (HCC) 01/04/2023   Unspecified dementia, unspecified severity, with anxiety (HCC) 01/04/2023   Hyperlipidemia, unspecified 01/04/2023   Paralytic syndrome, unspecified (HCC) 01/04/2023   Overactive bladder 01/04/2023   Personal history of traumatic brain injury 01/04/2023   Hypertensive heart disease with heart failure (HCC) 01/04/2023   Vitamin D deficiency 09/07/2020   Vitamin B12 deficiency 09/07/2020   TBI (traumatic brain injury) (HCC) 09/07/2020   Intellectual disability 09/07/2020   Symptomatic anemia 09/06/2020   Schizophrenia (HCC)    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HCC)    OAB (overactive bladder)    HTN (hypertension)    COPD (chronic obstructive pulmonary disease) (HCC)    Generalized anxiety disorder    CVA (cerebral vascular accident) (HCC)    Diastolic heart failure (HCC)    OA (osteoarthritis)    Memory loss    Dementia (HCC)    Hypokalemia    Rectal bleeding    Hemorrhoids     Orientation RESPIRATION BLADDER Height & Weight     Self, Place  Normal Incontinent Weight: 190 lb (86.2 kg) Height:  5\' 7"  (170.2 cm)  BEHAVIORAL SYMPTOMS/MOOD NEUROLOGICAL BOWEL NUTRITION  STATUS      Incontinent Diet (No concentrated sweets)  AMBULATORY STATUS COMMUNICATION OF NEEDS Skin   Total Care Verbally Normal                       Personal Care Assistance Level of Assistance  Bathing, Feeding, Dressing Bathing Assistance: Maximum assistance Feeding assistance: Maximum assistance Dressing Assistance: Maximum assistance     Functional Limitations Info  Sight, Hearing, Speech Sight Info: Adequate Hearing Info: Adequate Speech Info: Adequate    SPECIAL CARE FACTORS FREQUENCY                       Contractures Contractures Info: Not present    Additional Factors Info  Code Status, Allergies Code Status Info: Full Allergies Info: 5-Alpha Reductase Inhibitors           Current Medications (02/01/2024):  This is the current hospital active medication list Current Facility-Administered Medications  Medication Dose Route Frequency Provider Last Rate Last Admin   acetaminophen (TYLENOL) tablet 650 mg  650 mg Oral Q8H PRN Tyrone Nine, MD   650 mg at 02/01/24 0010   albuterol (PROVENTIL) (2.5 MG/3ML) 0.083% nebulizer solution 2.5 mg  2.5 mg Nebulization Q2H PRN Tyrone Nine, MD       aspirin EC tablet 81 mg  81 mg Oral Daily Tyrone Nine, MD   81 mg at 02/01/24 0846   cycloSPORINE (RESTASIS) 0.05 % ophthalmic emulsion 1 drop  1 drop  Both Eyes BID Tyrone Nine, MD   1 drop at 02/01/24 0845   enoxaparin (LOVENOX) injection 40 mg  40 mg Subcutaneous Q24H Hazeline Junker B, MD   40 mg at 01/31/24 2123   ferrous sulfate tablet 325 mg  325 mg Oral Q breakfast Tyrone Nine, MD   325 mg at 02/01/24 0846   fluticasone (FLONASE) 50 MCG/ACT nasal spray 2 spray  2 spray Each Nare Daily Tyrone Nine, MD   2 spray at 02/01/24 0846   furosemide (LASIX) tablet 20 mg  20 mg Oral Daily Hazeline Junker B, MD   20 mg at 02/01/24 0846   gabapentin (NEURONTIN) capsule 100 mg  100 mg Oral TID Tyrone Nine, MD   100 mg at 02/01/24 0847   LORazepam (ATIVAN) tablet 0.5 mg   0.5 mg Oral BID Hazeline Junker B, MD   0.5 mg at 02/01/24 0849   nicotine (NICODERM CQ - dosed in mg/24 hours) patch 21 mg  21 mg Transdermal Daily Hazeline Junker B, MD       ondansetron (ZOFRAN-ODT) disintegrating tablet 4 mg  4 mg Oral Q8H PRN Tyrone Nine, MD       oxybutynin (DITROPAN-XL) 24 hr tablet 5 mg  5 mg Oral Daily Hazeline Junker B, MD   5 mg at 02/01/24 0846   pantoprazole (PROTONIX) EC tablet 40 mg  40 mg Oral BID AC Hazeline Junker B, MD   40 mg at 02/01/24 0847   PARoxetine (PAXIL) tablet 30 mg  30 mg Oral Daily Hazeline Junker B, MD   30 mg at 02/01/24 0846   perflutren lipid microspheres (DEFINITY) IV suspension  1-10 mL Intravenous PRN Hazeline Junker B, MD   3 mL at 02/01/24 1357   polyethylene glycol (MIRALAX / GLYCOLAX) packet 17 g  17 g Oral BID Hazeline Junker B, MD   17 g at 02/01/24 0847   polyvinyl alcohol (LIQUIFILM TEARS) 1.4 % ophthalmic solution 1 drop  1 drop Both Eyes BID PRN Tyrone Nine, MD       risperiDONE (RISPERDAL) tablet 3 mg  3 mg Oral BID Tyrone Nine, MD   3 mg at 02/01/24 1610   senna-docusate (Senokot-S) tablet 2 tablet  2 tablet Oral QHS Tyrone Nine, MD   2 tablet at 01/31/24 2123     Discharge Medications:  TAKE these medications     acetaminophen 650 MG CR tablet Commonly known as: TYLENOL Take 650 mg by mouth every 8 (eight) hours as needed (back pain).    aspirin EC 81 MG tablet Take 81 mg by mouth daily. Swallow whole.    carboxymethylcellulose 0.5 % Soln Commonly known as: REFRESH PLUS Apply 1 drop to eye in the morning and at bedtime. Refresh Classic    CERTAVITE SENIOR/ANTIOXIDANT PO Take 1 tablet by mouth daily.    cycloSPORINE 0.05 % ophthalmic emulsion Commonly known as: RESTASIS Place 1 drop into both eyes 2 (two) times daily.    Diclofenac Sodium 3 % Gel Apply 1 gram (1 inch = 1 gram) to the affected area 3 times daily    famotidine 20 MG tablet Commonly known as: PEPCID Take 20 mg by mouth daily.    fenofibrate 145 MG  tablet Commonly known as: TRICOR Take 145 mg by mouth daily.    ferrous sulfate 325 (65 FE) MG tablet Take 325 mg by mouth daily with breakfast.    fluticasone 50 MCG/ACT nasal spray Commonly known as:  FLONASE Place 2 sprays into both nostrils daily.    furosemide 20 MG tablet Commonly known as: LASIX Take 20 mg by mouth daily.    gabapentin 100 MG capsule Commonly known as: NEURONTIN Take 100 mg by mouth 3 (three) times daily.    LORazepam 0.5 MG tablet Commonly known as: ATIVAN Take 1 tablet (0.5 mg total) by mouth 2 (two) times daily.    Meloxicam 5 MG Caps Take 1 capsule by mouth daily.    ondansetron 4 MG disintegrating tablet Commonly known as: ZOFRAN-ODT Take 1 tablet (4 mg total) by mouth every 8 (eight) hours as needed for nausea or vomiting.    oxybutynin 5 MG 24 hr tablet Commonly known as: DITROPAN-XL Take 5 mg by mouth daily.    pantoprazole 40 MG tablet Commonly known as: PROTONIX Take 1 tablet (40 mg total) by mouth 2 (two) times daily before a meal.    PARoxetine 30 MG tablet Commonly known as: PAXIL Take 30 mg by mouth daily.    polyethylene glycol 17 g packet Commonly known as: MIRALAX / GLYCOLAX Take 17 g by mouth 2 (two) times daily.    potassium chloride 10 MEQ tablet Commonly known as: KLOR-CON M Take 10 mEq by mouth daily.    predniSONE 10 MG tablet Commonly known as: DELTASONE Take 10 mg by mouth 2 (two) times daily.    risperiDONE 3 MG tablet Commonly known as: RISPERDAL Take 3 mg by mouth 2 (two) times daily.    sennosides-docusate sodium 8.6-50 MG tablet Commonly known as: SENOKOT-S Take 2 tablets by mouth at bedtime.    sucralfate 1 g tablet Commonly known as: Carafate Take 1 tablet (1 g total) by mouth 4 (four) times daily -  with meals and at bedtime.    vitamin A & D ointment Apply 1 application topically as needed for dry skin.           Relevant Imaging Results:  Relevant Lab Results:   Additional  Information Home Health PT and OT. Referred to [facility]  Elliot Gault, LCSW
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
## 2024-02-01 NOTE — Plan of Care (Signed)
   Problem: Education: Goal: Knowledge of General Education information will improve Description Including pain rating scale, medication(s)/side effects and non-pharmacologic comfort measures Outcome: Progressing   Problem: Health Behavior/Discharge Planning: Goal: Ability to manage health-related needs will improve Outcome: Progressing
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
## 2024-02-08 ENCOUNTER — Other Ambulatory Visit: Payer: Self-pay | Admitting: Orthopedic Surgery

## 2024-02-08 ENCOUNTER — Other Ambulatory Visit: Payer: Self-pay | Admitting: Gastroenterology

## 2024-02-08 DIAGNOSIS — K5900 Constipation, unspecified: Secondary | ICD-10-CM

## 2024-02-08 DIAGNOSIS — M25572 Pain in left ankle and joints of left foot: Secondary | ICD-10-CM

## 2024-04-08 ENCOUNTER — Other Ambulatory Visit: Payer: Self-pay | Admitting: Gastroenterology

## 2024-04-17 ENCOUNTER — Other Ambulatory Visit: Payer: Self-pay | Admitting: Orthopedic Surgery

## 2024-04-17 DIAGNOSIS — M25572 Pain in left ankle and joints of left foot: Secondary | ICD-10-CM

## 2024-05-07 ENCOUNTER — Emergency Department (HOSPITAL_COMMUNITY)

## 2024-05-07 ENCOUNTER — Emergency Department (HOSPITAL_COMMUNITY)
Admission: EM | Admit: 2024-05-07 | Discharge: 2024-05-07 | Disposition: A | Discharge status: Home / Self Care | Source: Skilled Nursing Facility | Attending: Emergency Medicine | Admitting: Emergency Medicine

## 2024-05-07 ENCOUNTER — Other Ambulatory Visit: Payer: Self-pay

## 2024-05-07 ENCOUNTER — Other Ambulatory Visit: Payer: Self-pay | Admitting: Gastroenterology

## 2024-05-07 ENCOUNTER — Encounter (HOSPITAL_COMMUNITY): Payer: Self-pay | Admitting: Emergency Medicine

## 2024-05-07 DIAGNOSIS — S8991XA Unspecified injury of right lower leg, initial encounter: Secondary | ICD-10-CM | POA: Diagnosis present

## 2024-05-07 DIAGNOSIS — Z7982 Long term (current) use of aspirin: Secondary | ICD-10-CM | POA: Diagnosis not present

## 2024-05-07 DIAGNOSIS — S80211A Abrasion, right knee, initial encounter: Secondary | ICD-10-CM | POA: Insufficient documentation

## 2024-05-07 DIAGNOSIS — S5011XA Contusion of right forearm, initial encounter: Secondary | ICD-10-CM

## 2024-05-07 DIAGNOSIS — S50811A Abrasion of right forearm, initial encounter: Secondary | ICD-10-CM | POA: Diagnosis not present

## 2024-05-07 DIAGNOSIS — W050XXA Fall from non-moving wheelchair, initial encounter: Secondary | ICD-10-CM | POA: Insufficient documentation

## 2024-05-07 MED ORDER — ACETAMINOPHEN 500 MG PO TABS
1000.0000 mg | ORAL_TABLET | Freq: Once | ORAL | Status: AC
  Administered 2024-05-07: 1000 mg via ORAL
  Filled 2024-05-07: qty 2

## 2024-05-07 MED ORDER — BACITRACIN ZINC 500 UNIT/GM EX OINT
TOPICAL_OINTMENT | Freq: Once | CUTANEOUS | Status: AC
  Administered 2024-05-07: 1 via TOPICAL
  Filled 2024-05-07: qty 4.5

## 2024-05-07 NOTE — ED Notes (Signed)
 Patient has scrapes to her right arm and right leg. Patient has no other complaints, no bruising noted.

## 2024-05-07 NOTE — ED Notes (Signed)
 Pt has been added to the convo list back to [facility]

## 2024-05-07 NOTE — Discharge Instructions (Addendum)
 It was our pleasure to provide your ER care today - we hope that you feel better.  Fall precautions. Keep abrasions very clean.   Follow up with primary care doctor in one week if symptoms fail to improve/resolve.  Return to ER if worse, new symptoms, new/severe pain, infection of wounds, or other concern.
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
## 2024-05-07 NOTE — ED Notes (Signed)
 Patients wounds were cleaned, bacitracin  applied, and wounds covered.

## 2024-05-07 NOTE — ED Notes (Signed)
 Spoke with patients legal guardian and gave her a medical update.

## 2024-05-08 ENCOUNTER — Other Ambulatory Visit: Payer: Self-pay | Admitting: Gastroenterology

## 2024-05-08 NOTE — Telephone Encounter (Signed)
 We can stop carafate . I will d/c in the chart and we will need to send to facility. She is due for follow-up.
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
## 2024-06-02 ENCOUNTER — Encounter: Payer: Self-pay | Admitting: Gastroenterology

## 2024-06-02 ENCOUNTER — Ambulatory Visit (INDEPENDENT_AMBULATORY_CARE_PROVIDER_SITE_OTHER): Admitting: Gastroenterology

## 2024-06-02 VITALS — BP 130/86 | HR 68 | Temp 98.1°F | Wt 198.0 lb

## 2024-06-02 DIAGNOSIS — K5900 Constipation, unspecified: Secondary | ICD-10-CM | POA: Diagnosis not present

## 2024-06-02 DIAGNOSIS — K769 Liver disease, unspecified: Secondary | ICD-10-CM | POA: Diagnosis not present

## 2024-06-02 DIAGNOSIS — K219 Gastro-esophageal reflux disease without esophagitis: Secondary | ICD-10-CM

## 2024-06-02 DIAGNOSIS — D509 Iron deficiency anemia, unspecified: Secondary | ICD-10-CM

## 2024-06-02 NOTE — Patient Instructions (Addendum)
 We will schedule you for a CT of your abdomen to follow-up on the lesion on your liver.   I recommend that all of your other current medications are continued.  We will follow-up with you in 6 months or sooner if needed.  Josette Centers, PA-C [facility]

## 2024-06-03 ENCOUNTER — Other Ambulatory Visit (HOSPITAL_COMMUNITY): Payer: Self-pay | Admitting: Family Medicine

## 2024-06-03 DIAGNOSIS — F17210 Nicotine dependence, cigarettes, uncomplicated: Secondary | ICD-10-CM

## 2024-06-03 DIAGNOSIS — F17200 Nicotine dependence, unspecified, uncomplicated: Secondary | ICD-10-CM

## 2024-06-18 ENCOUNTER — Ambulatory Visit (HOSPITAL_COMMUNITY)
Admission: RE | Admit: 2024-06-18 | Discharge: 2024-06-18 | Disposition: A | Discharge status: Home / Self Care | Source: Ambulatory Visit | Attending: Gastroenterology | Admitting: Gastroenterology

## 2024-06-18 DIAGNOSIS — F17210 Nicotine dependence, cigarettes, uncomplicated: Secondary | ICD-10-CM | POA: Insufficient documentation

## 2024-06-18 DIAGNOSIS — K769 Liver disease, unspecified: Secondary | ICD-10-CM | POA: Insufficient documentation

## 2024-06-18 DIAGNOSIS — F17200 Nicotine dependence, unspecified, uncomplicated: Secondary | ICD-10-CM | POA: Diagnosis present

## 2024-06-18 MED ORDER — IOHEXOL 300 MG/ML  SOLN
100.0000 mL | Freq: Once | INTRAMUSCULAR | Status: AC | PRN
  Administered 2024-06-18 (×2): 100 mL via INTRAVENOUS

## 2024-07-04 ENCOUNTER — Ambulatory Visit: Payer: Self-pay | Admitting: Gastroenterology

## 2024-07-10 NOTE — Progress Notes (Signed)
 CT results were mailed to [facility].

## 2024-07-11 ENCOUNTER — Other Ambulatory Visit: Payer: Self-pay | Admitting: *Deleted

## 2024-07-11 ENCOUNTER — Other Ambulatory Visit: Payer: Self-pay

## 2024-07-11 ENCOUNTER — Encounter: Payer: Self-pay | Admitting: *Deleted

## 2024-07-11 ENCOUNTER — Other Ambulatory Visit: Payer: Self-pay | Admitting: Internal Medicine

## 2024-07-11 ENCOUNTER — Telehealth: Payer: Self-pay

## 2024-07-11 MED ORDER — LINACLOTIDE 145 MCG PO CAPS: ORAL_CAPSULE | ORAL | 0 refills | Status: DC

## 2024-07-11 MED ORDER — PEG 3350-KCL-NA BICARB-NACL 420 G PO SOLR: 4000.0000 mL | Freq: Once | ORAL | 0 refills | Status: DC

## 2024-07-11 MED ORDER — LINACLOTIDE 145 MCG PO CAPS: 145.0000 ug | ORAL_CAPSULE | Freq: Every day | ORAL | Status: DC

## 2024-07-11 MED ORDER — LINACLOTIDE 145 MCG PO CAPS: 145.0000 ug | ORAL_CAPSULE | Freq: Every day | ORAL | Status: AC

## 2024-07-11 NOTE — Telephone Encounter (Signed)
 Noted. Thanks.

## 2024-07-11 NOTE — Addendum Note (Signed)
 Addended by: RUDY NEPTUNE on: 07/11/2024 10:37 AM   Modules accepted: Orders

## 2024-07-11 NOTE — Telephone Encounter (Signed)
 Medication Samples have been provided to the patient.  Drug name: Linzess        Strength: 145mcg        Qty: 1  LOT: [id number]  Exp.Date: 01/2025  Dosing instructions: Once daily 30 mins before breakfast  The patient's caregiver has been instructed regarding the correct time, dose, and frequency of taking this medication, including desired effects and most common side effects.   Karen Craig 10:59 AM 07/11/2024

## 2024-07-13 ENCOUNTER — Other Ambulatory Visit: Payer: Self-pay

## 2024-07-13 ENCOUNTER — Emergency Department (HOSPITAL_COMMUNITY)
Admission: EM | Admit: 2024-07-13 | Discharge: 2024-07-13 | Disposition: A | Discharge status: Home / Self Care | Source: Skilled Nursing Facility | Attending: Emergency Medicine | Admitting: Emergency Medicine

## 2024-07-13 ENCOUNTER — Encounter (HOSPITAL_COMMUNITY): Payer: Self-pay | Admitting: Emergency Medicine

## 2024-07-13 ENCOUNTER — Emergency Department (HOSPITAL_COMMUNITY)

## 2024-07-13 DIAGNOSIS — Z8673 Personal history of transient ischemic attack (TIA), and cerebral infarction without residual deficits: Secondary | ICD-10-CM | POA: Insufficient documentation

## 2024-07-13 DIAGNOSIS — S42215A Unspecified nondisplaced fracture of surgical neck of left humerus, initial encounter for closed fracture: Secondary | ICD-10-CM | POA: Insufficient documentation

## 2024-07-13 DIAGNOSIS — I10 Essential (primary) hypertension: Secondary | ICD-10-CM | POA: Insufficient documentation

## 2024-07-13 DIAGNOSIS — J449 Chronic obstructive pulmonary disease, unspecified: Secondary | ICD-10-CM | POA: Insufficient documentation

## 2024-07-13 DIAGNOSIS — S4992XA Unspecified injury of left shoulder and upper arm, initial encounter: Secondary | ICD-10-CM | POA: Diagnosis present

## 2024-07-13 DIAGNOSIS — F17210 Nicotine dependence, cigarettes, uncomplicated: Secondary | ICD-10-CM | POA: Insufficient documentation

## 2024-07-13 DIAGNOSIS — W06XXXA Fall from bed, initial encounter: Secondary | ICD-10-CM | POA: Diagnosis not present

## 2024-07-13 DIAGNOSIS — W19XXXA Unspecified fall, initial encounter: Secondary | ICD-10-CM

## 2024-07-13 NOTE — ED Triage Notes (Signed)
 Pt bib EMS from [facility] after she was sliding out of bed and was assisted to floor by staff but still landed on L shoulder. Pt with decreased ROM to L shoulder per EMS.

## 2024-07-13 NOTE — Discharge Instructions (Signed)
 You were evaluated in the Emergency Department and after careful evaluation, we did not find any emergent condition requiring admission or further testing in the hospital.  Your exam/testing today was overall reassuring.  You have a broken bone in your shoulder.  Use the sling and follow up with the bone expert.  Use tylenol  for pain.  Please return to the Emergency Department if you experience any worsening of your condition.  Thank you for allowing us  to be a part of your care.
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
## 2024-07-14 ENCOUNTER — Other Ambulatory Visit: Payer: Self-pay | Admitting: *Deleted

## 2024-07-14 MED ORDER — PEG 3350-KCL-NA BICARB-NACL 420 G PO SOLR: 4000.0000 mL | Freq: Once | ORAL | 0 refills | Status: AC

## 2024-07-21 ENCOUNTER — Other Ambulatory Visit: Payer: Self-pay

## 2024-07-21 DIAGNOSIS — E0590 Thyrotoxicosis, unspecified without thyrotoxic crisis or storm: Secondary | ICD-10-CM

## 2024-07-24 ENCOUNTER — Other Ambulatory Visit: Payer: Self-pay | Admitting: Nurse Practitioner

## 2024-07-24 DIAGNOSIS — E0590 Thyrotoxicosis, unspecified without thyrotoxic crisis or storm: Secondary | ICD-10-CM

## 2024-07-24 DIAGNOSIS — R7989 Other specified abnormal findings of blood chemistry: Secondary | ICD-10-CM

## 2024-07-28 ENCOUNTER — Ambulatory Visit: Payer: Medicare Other | Admitting: Nurse Practitioner

## 2024-07-28 DIAGNOSIS — E0590 Thyrotoxicosis, unspecified without thyrotoxic crisis or storm: Secondary | ICD-10-CM

## 2024-07-29 NOTE — Patient Instructions (Signed)
 Karen Craig  07/29/2024     @PREFPERIOPPHARMACY @   Your procedure is scheduled on  08/04/2024.   Report to [facility] at  0915 A.M.   Call this number if you have problems the morning of surgery:  [phone]  If you experience any cold or flu symptoms such as cough, fever, chills, shortness of breath, etc. between now and your scheduled surgery, please notify us  at the above number.   Remember:  Follow the diet and prep instructions given to you by the office.   You may drink clear liquids until 0715 am on 08/04/2024.      Clear liquids allowed are:                    Water , Juice (No red color; non-citric and without pulp; diabetics please choose diet or no sugar options), Carbonated beverages (diabetics please choose diet or no sugar options), Clear Tea (No creamer, milk, or cream, including half & half and powdered creamer), Black Coffee Only (No creamer, milk or cream, including half & half and powdered creamer), and Clear Sports drink (No red color; diabetics please choose diet or no sugar options)    Take these medicines the morning of surgery with A SIP OF WATER           famotidine, gabapentin , lorazepam (if needed), oxybutynin , pantoprazole , paroxetine , prednisone, risperdol, zofran  (if needed).    Do not wear jewelry, make-up or nail polish, including gel polish,  artificial nails, or any other type of covering on natural nails (fingers and  toes).  Do not wear lotions, powders, or perfumes, or deodorant.  Do not shave 48 hours prior to surgery.  Men may shave face and neck.  Do not bring valuables to the hospital.  [facility] is not responsible for any belongings or valuables.  Contacts, dentures or bridgework may not be worn into surgery.  Leave your suitcase in the car.  After surgery it may be brought to your room.  For patients admitted to the hospital, discharge time will be determined by your treatment team.  Patients discharged the day of  surgery will not be allowed to drive home and must have someone with them for 24 hours.    Special instructions:   DO NOT smoke tobacco or vape for 24 hours before your procedure.  Please read over the following fact sheets that you were given. Anesthesia Post-op Instructions and Care and Recovery After Surgery      Upper Endoscopy, Adult, Care After After the procedure, it is common to have a sore throat. It is also common to have: Mild stomach pain or discomfort. Bloating. Nausea. Follow these instructions at home: The instructions below may help you care for yourself at home. Your health care provider may give you more instructions. If you have questions, ask your health care provider. If you were given a sedative during the procedure, it can affect you for several hours. Do not drive or operate machinery until your health care provider says that it is safe. If you will be going home right after the procedure, plan to have a responsible adult: Take you home from the hospital or clinic. You will not be allowed to drive. Care for you for the time you are told. Follow instructions from your health care provider about what you may eat and drink. Return to your normal activities as told by your health care provider. Ask your health care provider  what activities are safe for you. Take over-the-counter and prescription medicines only as told by your health care provider. Contact a health care provider if you: Have a sore throat that lasts longer than one day. Have trouble swallowing. Have a fever. Get help right away if you: Vomit blood or your vomit looks like coffee grounds. Have bloody, black, or tarry stools. Have a very bad sore throat or you cannot swallow. Have difficulty breathing or very bad pain in your chest or abdomen. These symptoms may be an emergency. Get help right away. Call 911. Do not wait to see if the symptoms will go away. Do not drive yourself to the  hospital. Summary After the procedure, it is common to have a sore throat, mild stomach discomfort, bloating, and nausea. If you were given a sedative during the procedure, it can affect you for several hours. Do not drive until your health care provider says that it is safe. Follow instructions from your health care provider about what you may eat and drink. Return to your normal activities as told by your health care provider. This information is not intended to replace advice given to you by your health care provider. Make sure you discuss any questions you have with your health care provider. Document Revised: 02/01/2022 Document Reviewed: 02/01/2022 Elsevier Patient Education  2024 [employer].Colonoscopy, Adult, Care After The following information offers guidance on how to care for yourself after your procedure. Your health care provider may also give you more specific instructions. If you have problems or questions, contact your health care provider. What can I expect after the procedure? After the procedure, it is common to have: A small amount of blood in your stool for 24 hours after the procedure. Some gas. Mild cramping or bloating of your abdomen. Follow these instructions at home: Eating and drinking  Drink enough fluid to keep your urine pale yellow. Follow instructions from your health care provider about eating or drinking restrictions. Resume your normal diet as told by your health care provider. Avoid heavy or fried foods that are hard to digest. Activity Rest as told by your health care provider. Avoid sitting for a long time without moving. Get up to take short walks every 1-2 hours. This is important to improve blood flow and breathing. Ask for help if you feel weak or unsteady. Return to your normal activities as told by your health care provider. Ask your health care provider what activities are safe for you. Managing cramping and bloating  Try walking around  when you have cramps or feel bloated. If directed, apply heat to your abdomen as told by your health care provider. Use the heat source that your health care provider recommends, such as a moist heat pack or a heating pad. Place a towel between your skin and the heat source. Leave the heat on for 20-30 minutes. Remove the heat if your skin turns bright red. This is especially important if you are unable to feel pain, heat, or cold. You have a greater risk of getting burned. General instructions If you were given a sedative during the procedure, it can affect you for several hours. Do not drive or operate machinery until your health care provider says that it is safe. For the first 24 hours after the procedure: Do not sign important documents. Do not drink alcohol . Do your regular daily activities at a slower pace than normal. Eat soft foods that are easy to digest. Take over-the-counter and prescription medicines only  as told by your health care provider. Keep all follow-up visits. This is important. Contact a health care provider if: You have blood in your stool 2-3 days after the procedure. Get help right away if: You have more than a small spotting of blood in your stool. You have large blood clots in your stool. You have swelling of your abdomen. You have nausea or vomiting. You have a fever. You have increasing pain in your abdomen that is not relieved with medicine. These symptoms may be an emergency. Get help right away. Call 911. Do not wait to see if the symptoms will go away. Do not drive yourself to the hospital. Summary After the procedure, it is common to have a small amount of blood in your stool. You may also have mild cramping and bloating of your abdomen. If you were given a sedative during the procedure, it can affect you for several hours. Do not drive or operate machinery until your health care provider says that it is safe. Get help right away if you have a lot of  blood in your stool, nausea or vomiting, a fever, or increased pain in your abdomen. This information is not intended to replace advice given to you by your health care provider. Make sure you discuss any questions you have with your health care provider. Document Revised: 12/05/2022 Document Reviewed: 06/15/2021 Elsevier Patient Education  2024 Else[employer] Inc.General Anesthesia, Adult, Care After The following information offers guidance on how to care for yourself after your procedure. Your health care provider may also give you more specific instructions. If you have problems or questions, contact your health care provider. What can I expect after the procedure? After the procedure, it is common for people to: Have pain or discomfort at the IV site. Have nausea or vomiting. Have a sore throat or hoarseness. Have trouble concentrating. Feel cold or chills. Feel weak, sleepy, or tired (fatigue). Have soreness and body aches. These can affect parts of the body that were not involved in surgery. Follow these instructions at home: For the time period you were told by your health care provider:  Rest. Do not participate in activities where you could fall or become injured. Do not drive or use machinery. Do not drink alcohol . Do not take sleeping pills or medicines that cause drowsiness. Do not make important decisions or sign legal documents. Do not take care of children on your own. General instructions Drink enough fluid to keep your urine pale yellow. If you have sleep apnea, surgery and certain medicines can increase your risk for breathing problems. Follow instructions from your health care provider about wearing your sleep device: Anytime you are sleeping, including during daytime naps. While taking prescription pain medicines, sleeping medicines, or medicines that make you drowsy. Return to your normal activities as told by your health care provider. Ask your health care provider what  activities are safe for you. Take over-the-counter and prescription medicines only as told by your health care provider. Do not use any products that contain nicotine  or tobacco. These products include cigarettes, chewing tobacco, and vaping devices, such as e-cigarettes. These can delay incision healing after surgery. If you need help quitting, ask your health care provider. Contact a health care provider if: You have nausea or vomiting that does not get better with medicine. You vomit every time you eat or drink. You have pain that does not get better with medicine. You cannot urinate or have bloody urine. You develop a skin rash.  You have a fever. Get help right away if: You have trouble breathing. You have chest pain. You vomit blood. These symptoms may be an emergency. Get help right away. Call 911. Do not wait to see if the symptoms will go away. Do not drive yourself to the hospital. Summary After the procedure, it is common to have a sore throat, hoarseness, nausea, vomiting, or to feel weak, sleepy, or fatigue. For the time period you were told by your health care provider, do not drive or use machinery. Get help right away if you have difficulty breathing, have chest pain, or vomit blood. These symptoms may be an emergency. This information is not intended to replace advice given to you by your health care provider. Make sure you discuss any questions you have with your health care provider. Document Revised: 01/20/2022 Document Reviewed: 01/20/2022 Elsevier Patient Education  2024 [employer].

## 2024-07-30 ENCOUNTER — Encounter (HOSPITAL_COMMUNITY): Payer: Self-pay

## 2024-07-30 ENCOUNTER — Encounter (HOSPITAL_COMMUNITY)
Admission: RE | Admit: 2024-07-30 | Discharge: 2024-07-30 | Disposition: A | Discharge status: Home / Self Care | Source: Ambulatory Visit | Attending: Internal Medicine | Admitting: Internal Medicine

## 2024-07-30 VITALS — BP 114/68 | HR 70 | Temp 98.0°F | Resp 18 | Ht 67.0 in | Wt 196.2 lb

## 2024-07-30 DIAGNOSIS — D649 Anemia, unspecified: Secondary | ICD-10-CM | POA: Insufficient documentation

## 2024-07-30 DIAGNOSIS — Z01812 Encounter for preprocedural laboratory examination: Secondary | ICD-10-CM | POA: Insufficient documentation

## 2024-07-30 DIAGNOSIS — E876 Hypokalemia: Secondary | ICD-10-CM | POA: Diagnosis not present

## 2024-07-30 LAB — CBC WITH DIFFERENTIAL/PLATELET
Abs Immature Granulocytes: 0.03 K/uL (ref 0.00–0.07)
Basophils Absolute: 0 K/uL (ref 0.0–0.1)
Basophils Relative: 1 %
Eosinophils Absolute: 0.1 K/uL (ref 0.0–0.5)
Eosinophils Relative: 1 %
HCT: 36.3 % (ref 36.0–46.0)
Hemoglobin: 11.7 g/dL — ABNORMAL LOW (ref 12.0–15.0)
Immature Granulocytes: 1 %
Lymphocytes Relative: 32 %
Lymphs Abs: 1.4 K/uL (ref 0.7–4.0)
MCH: 29.5 pg (ref 26.0–34.0)
MCHC: 32.2 g/dL (ref 30.0–36.0)
MCV: 91.7 fL (ref 80.0–100.0)
Monocytes Absolute: 0.3 K/uL (ref 0.1–1.0)
Monocytes Relative: 7 %
Neutro Abs: 2.4 K/uL (ref 1.7–7.7)
Neutrophils Relative %: 58 %
Platelets: 311 K/uL (ref 150–400)
RBC: 3.96 MIL/uL (ref 3.87–5.11)
RDW: 15.9 % — ABNORMAL HIGH (ref 11.5–15.5)
WBC: 4.2 K/uL (ref 4.0–10.5)
nRBC: 0 % (ref 0.0–0.2)

## 2024-07-30 LAB — BASIC METABOLIC PANEL WITH GFR
Anion gap: 10 (ref 5–15)
BUN: 15 mg/dL (ref 6–20)
CO2: 24 mmol/L (ref 22–32)
Calcium: 8.9 mg/dL (ref 8.9–10.3)
Chloride: 108 mmol/L (ref 98–111)
Creatinine, Ser: 0.88 mg/dL (ref 0.44–1.00)
GFR, Estimated: >60 mL/min (ref >60)
Glucose, Bld: 96 mg/dL (ref 70–99)
Potassium: 3.1 mmol/L — ABNORMAL LOW (ref 3.5–5.1)
Sodium: 142 mmol/L (ref 135–145)

## 2024-07-30 NOTE — Progress Notes (Signed)
 Anesthesia notified of K level drawn today no change to treatment plan

## 2024-08-04 ENCOUNTER — Encounter (HOSPITAL_COMMUNITY)
Admission: RE | Disposition: A | Discharge status: Home / Self Care | Payer: Self-pay | Source: Home / Self Care | Attending: Internal Medicine

## 2024-08-04 ENCOUNTER — Ambulatory Visit (HOSPITAL_COMMUNITY)
Admission: RE | Admit: 2024-08-04 | Discharge: 2024-08-04 | Disposition: A | Discharge status: Home / Self Care | Attending: Internal Medicine | Admitting: Internal Medicine

## 2024-08-04 ENCOUNTER — Encounter (HOSPITAL_COMMUNITY): Payer: Self-pay | Admitting: Internal Medicine

## 2024-08-04 ENCOUNTER — Ambulatory Visit (HOSPITAL_COMMUNITY): Admitting: Anesthesiology

## 2024-08-04 ENCOUNTER — Ambulatory Visit (HOSPITAL_BASED_OUTPATIENT_CLINIC_OR_DEPARTMENT_OTHER): Admitting: Anesthesiology

## 2024-08-04 DIAGNOSIS — K449 Diaphragmatic hernia without obstruction or gangrene: Secondary | ICD-10-CM | POA: Diagnosis not present

## 2024-08-04 DIAGNOSIS — G839 Paralytic syndrome, unspecified: Secondary | ICD-10-CM | POA: Diagnosis not present

## 2024-08-04 DIAGNOSIS — Z791 Long term (current) use of non-steroidal anti-inflammatories (NSAID): Secondary | ICD-10-CM | POA: Diagnosis not present

## 2024-08-04 DIAGNOSIS — D649 Anemia, unspecified: Secondary | ICD-10-CM | POA: Diagnosis not present

## 2024-08-04 DIAGNOSIS — J449 Chronic obstructive pulmonary disease, unspecified: Secondary | ICD-10-CM | POA: Insufficient documentation

## 2024-08-04 DIAGNOSIS — K2270 Barrett's esophagus without dysplasia: Secondary | ICD-10-CM | POA: Insufficient documentation

## 2024-08-04 DIAGNOSIS — I509 Heart failure, unspecified: Secondary | ICD-10-CM | POA: Insufficient documentation

## 2024-08-04 DIAGNOSIS — K2950 Unspecified chronic gastritis without bleeding: Secondary | ICD-10-CM | POA: Diagnosis not present

## 2024-08-04 DIAGNOSIS — Z79899 Other long term (current) drug therapy: Secondary | ICD-10-CM | POA: Insufficient documentation

## 2024-08-04 DIAGNOSIS — R933 Abnormal findings on diagnostic imaging of other parts of digestive tract: Secondary | ICD-10-CM | POA: Diagnosis present

## 2024-08-04 DIAGNOSIS — Z7952 Long term (current) use of systemic steroids: Secondary | ICD-10-CM | POA: Insufficient documentation

## 2024-08-04 DIAGNOSIS — I5030 Unspecified diastolic (congestive) heart failure: Secondary | ICD-10-CM | POA: Diagnosis not present

## 2024-08-04 DIAGNOSIS — F17210 Nicotine dependence, cigarettes, uncomplicated: Secondary | ICD-10-CM | POA: Insufficient documentation

## 2024-08-04 DIAGNOSIS — F319 Bipolar disorder, unspecified: Secondary | ICD-10-CM | POA: Diagnosis not present

## 2024-08-04 DIAGNOSIS — M1990 Unspecified osteoarthritis, unspecified site: Secondary | ICD-10-CM | POA: Diagnosis not present

## 2024-08-04 DIAGNOSIS — K5730 Diverticulosis of large intestine without perforation or abscess without bleeding: Secondary | ICD-10-CM | POA: Insufficient documentation

## 2024-08-04 DIAGNOSIS — K562 Volvulus: Secondary | ICD-10-CM | POA: Diagnosis not present

## 2024-08-04 DIAGNOSIS — Z7982 Long term (current) use of aspirin: Secondary | ICD-10-CM | POA: Insufficient documentation

## 2024-08-04 DIAGNOSIS — I110 Hypertensive heart disease with heart failure: Secondary | ICD-10-CM | POA: Insufficient documentation

## 2024-08-04 DIAGNOSIS — K2970 Gastritis, unspecified, without bleeding: Secondary | ICD-10-CM

## 2024-08-04 DIAGNOSIS — K648 Other hemorrhoids: Secondary | ICD-10-CM | POA: Insufficient documentation

## 2024-08-04 DIAGNOSIS — D123 Benign neoplasm of transverse colon: Secondary | ICD-10-CM | POA: Diagnosis not present

## 2024-08-04 DIAGNOSIS — F419 Anxiety disorder, unspecified: Secondary | ICD-10-CM | POA: Insufficient documentation

## 2024-08-04 HISTORY — PX: COLONOSCOPY: SHX5424

## 2024-08-04 HISTORY — PX: ESOPHAGOGASTRODUODENOSCOPY: SHX5428

## 2024-08-04 SURGERY — COLONOSCOPY
Anesthesia: General

## 2024-08-04 MED ORDER — LIDOCAINE 2% (20 MG/ML) 5 ML SYRINGE
INTRAMUSCULAR | Status: AC
  Filled 2024-08-04: qty 10

## 2024-08-04 MED ORDER — LIDOCAINE 2% (20 MG/ML) 5 ML SYRINGE
INTRAMUSCULAR | Status: DC | PRN
  Administered 2024-08-04: 60 mg via INTRAVENOUS

## 2024-08-04 MED ORDER — PANTOPRAZOLE SODIUM 40 MG PO TBEC: 40.0000 mg | DELAYED_RELEASE_TABLET | Freq: Two times a day (BID) | ORAL | 11 refills | Status: AC

## 2024-08-04 MED ORDER — LACTATED RINGERS IV SOLN: INTRAVENOUS | Status: DC

## 2024-08-04 MED ORDER — PROPOFOL 500 MG/50ML IV EMUL
INTRAVENOUS | Status: DC | PRN
  Administered 2024-08-04: 70 mg via INTRAVENOUS
  Administered 2024-08-04: 100 ug/kg/min via INTRAVENOUS

## 2024-08-04 MED ORDER — PROPOFOL 500 MG/50ML IV EMUL
INTRAVENOUS | Status: AC
  Filled 2024-08-04: qty 150

## 2024-08-04 NOTE — Op Note (Signed)
 [facility] Patient Name: Karen Craig Procedure Date: 08/04/2024 9:50 AM MRN: [id number] Date of Birth: 1966/06/07 Attending MD: Carlin POUR. Cindie , [place], [id number] CSN: [id number] Age: 58 Admit Type: Outpatient Procedure:                Colonoscopy Indications:              Abnormal CT of the GI tract Providers:                Carlin POUR. Cindie, DO, Tammy Vaught, RN, Bascom Blush Referring MD:              Medicines:                See the Anesthesia note for documentation of the                            administered medications Complications:            No immediate complications. Estimated Blood Loss:     Estimated blood loss was minimal. Procedure:                Pre-Anesthesia Assessment:                           - The anesthesia plan was to use monitored                            anesthesia care (MAC).                           After obtaining informed consent, the colonoscope                            was passed under direct vision. Throughout the                            procedure, the patient's blood pressure, pulse, and                            oxygen saturations were monitored continuously. The                            [id number]-HQ190L ([id number]) Peds Colon was introduced                            through the anus and advanced to the the cecum,                            identified by appendiceal orifice and ileocecal                            valve. The colonoscopy was technically difficult                            and complex due to a redundant  colon and                            significant looping. Successful completion of the                            procedure was aided by applying abdominal pressure.                            The patient tolerated the procedure well. The                            quality of the bowel preparation was evaluated                            using the BBPS [facility] Bowel Preparation Scale)                             with scores of: Right Colon = 2 (minor amount of                            residual staining, small fragments of stool and/or                            opaque liquid, but mucosa seen well), Transverse                            Colon = 2 (minor amount of residual staining, small                            fragments of stool and/or opaque liquid, but mucosa                            seen well) and Left Colon = 2 (minor amount of                            residual staining, small fragments of stool and/or                            opaque liquid, but mucosa seen well). The total                            BBPS score equals 6. The quality of the bowel                            preparation was good. Scope In: 10:22:08 AM Scope Out: 10:46:17 AM Scope Withdrawal Time: 0 hours 13 minutes 58 seconds  Total Procedure Duration: 0 hours 24 minutes 9 seconds  Findings:      Non-bleeding internal hemorrhoids were found.      Many large-mouthed and small-mouthed diverticula were found in the       sigmoid colon, descending colon and transverse colon.      A 5 mm polyp was found in the transverse  colon. The polyp was sessile.       The polyp was removed with a cold snare. Resection and retrieval were       complete.      A moderate amount of semi-liquid stool was found in the proximal       transverse colon, in the ascending colon and in the cecum, making       visualization difficult. Lavage of the area was performed using copious       amounts of sterile water , resulting in clearance with good visualization. Impression:               - Non-bleeding internal hemorrhoids.                           - Diverticulosis in the sigmoid colon, in the                            descending colon and in the transverse colon.                           - One 5 mm polyp in the transverse colon, removed                            with a cold snare. Resected and retrieved.                            - Stool in the proximal transverse colon, in the                            ascending colon and in the cecum. Moderate Sedation:      Per Anesthesia Care Recommendation:           - Patient has a contact number available for                            emergencies. The signs and symptoms of potential                            delayed complications were discussed with the                            patient. Return to normal activities tomorrow.                            Written discharge instructions were provided to the                            patient.                           - Resume previous diet.                           - Continue present medications.                           - Await  pathology results.                           - Repeat colonoscopy in 7 years for surveillance.                           - Return to GI clinic in 3 months.                           **Patient suffered nondisplaced proximal left                            humeral fracture 07/13/24. Currently in sling.                            Discussed with Orthopedic surgeon prior to case who                            recommended performing EGD/Colonoscopy with patient                            supine to reduce pressure on her L arm. Both EGD                            and Colonoscopy were subsequently performed with                            patient in supine position** Procedure Code(s):        --- Professional ---                           [phone], Colonoscopy, flexible; with removal of                            tumor(s), polyp(s), or other lesion(s) by snare                            technique Diagnosis Code(s):        --- Professional ---                           K64.8, Other hemorrhoids                           D12.3, Benign neoplasm of transverse colon (hepatic                            flexure or splenic flexure)                           K57.30, Diverticulosis of large intestine without                             perforation or abscess without bleeding  R93.3, Abnormal findings on diagnostic imaging of                            other parts of digestive tract CPT copyright 2022 American Medical Association. All rights reserved. The codes documented in this report are preliminary and upon coder review may  be revised to meet current compliance requirements. Carlin POUR. Cindie, DO Carlin POUR. Cindie, DO 08/04/2024 10:55:22 AM This report has been signed electronically. Number of Addenda: 0

## 2024-08-04 NOTE — Discharge Instructions (Addendum)
 EGD Discharge instructions Please read the instructions outlined below and refer to this sheet in the next few weeks. These discharge instructions provide you with general information on caring for yourself after you leave the hospital. Your doctor may also give you specific instructions. While your treatment has been planned according to the most current medical practices available, unavoidable complications occasionally occur. If you have any problems or questions after discharge, please call your doctor. ACTIVITY You may resume your regular activity but move at a slower pace for the next 24 hours.  Take frequent rest periods for the next 24 hours.  Walking will help expel (get rid of) the air and reduce the bloated feeling in your abdomen.  No driving for 24 hours (because of the anesthesia (medicine) used during the test).  You may shower.  Do not sign any important legal documents or operate any machinery for 24 hours (because of the anesthesia used during the test).  NUTRITION Drink plenty of fluids.  You may resume your normal diet.  Begin with a light meal and progress to your normal diet.  Avoid alcoholic beverages for 24 hours or as instructed by your caregiver.  MEDICATIONS You may resume your normal medications unless your caregiver tells you otherwise.  WHAT YOU CAN EXPECT TODAY You may experience abdominal discomfort such as a feeling of fullness or "gas" pains.  FOLLOW-UP Your doctor will discuss the results of your test with you.  SEEK IMMEDIATE MEDICAL ATTENTION IF ANY OF THE FOLLOWING OCCUR: Excessive nausea (feeling sick to your stomach) and/or vomiting.  Severe abdominal pain and distention (swelling).  Trouble swallowing.  Temperature over 101 F (37.8 C).  Rectal bleeding or vomiting of blood.      Colonoscopy Discharge Instructions  Read the instructions outlined below and refer to this sheet in the next few weeks. These discharge instructions provide you  with general information on caring for yourself after you leave the hospital. Your doctor may also give you specific instructions. While your treatment has been planned according to the most current medical practices available, unavoidable complications occasionally occur.   ACTIVITY You may resume your regular activity, but move at a slower pace for the next 24 hours.  Take frequent rest periods for the next 24 hours.  Walking will help get rid of the air and reduce the bloated feeling in your belly (abdomen).  No driving for 24 hours (because of the medicine (anesthesia) used during the test).   Do not sign any important legal documents or operate any machinery for 24 hours (because of the anesthesia used during the test).  NUTRITION Drink plenty of fluids.  You may resume your normal diet as instructed by your doctor.  Begin with a light meal and progress to your normal diet. Heavy or fried foods are harder to digest and may make you feel sick to your stomach (nauseated).  Avoid alcoholic beverages for 24 hours or as instructed.  MEDICATIONS You may resume your normal medications unless your doctor tells you otherwise.  WHAT YOU CAN EXPECT TODAY Some feelings of bloating in the abdomen.  Passage of more gas than usual.  Spotting of blood in your stool or on the toilet paper.  IF YOU HAD POLYPS REMOVED DURING THE COLONOSCOPY: No aspirin  products for 7 days or as instructed.  No alcohol  for 7 days or as instructed.  Eat a soft diet for the next 24 hours.  FINDING OUT THE RESULTS OF YOUR TEST Not all test results  are available during your visit. If your test results are not back during the visit, make an appointment with your caregiver to find out the results. Do not assume everything is normal if you have not heard from your caregiver or the medical facility. It is important for you to follow up on all of your test results.  SEEK IMMEDIATE MEDICAL ATTENTION IF: You have more than a  spotting of blood in your stool.  Your belly is swollen (abdominal distention).  You are nauseated or vomiting.  You have a temperature over 101.  You have abdominal pain or discomfort that is severe or gets worse throughout the day.   Your EGD revealed moderate amount inflammation in your stomach and a few small linear ulcers (likely NSAID induced from Meloxicam).  I took biopsies of this to rule out infection with a bacteria called H. pylori. Hiatal hernia again noted. I took samples of your esophagus as well. Await pathology results, my office will contact you.  Continue on pantoprazole  40 mg twice daily. Limit NSAID use as best as your can though will be difficult given your recent fracture. Tylenol  is okay to take.   Your colonoscopy revealed 1 polyp(s) which I removed successfully. Await pathology results, my office will contact you. I recommend repeating colonoscopy in 7 years for surveillance purposes. No evidence of mass or colon cancer.    Follow up in GI office in 2-3 months.    I hope you have a great rest of your week!  Carlin POUR. Cindie, D.O. Gastroenterology and Hepatology [facility]

## 2024-08-04 NOTE — Transfer of Care (Signed)
 Immediate Anesthesia Transfer of Care Note  Patient: Karen Craig  Procedure(s) Performed: COLONOSCOPY EGD (ESOPHAGOGASTRODUODENOSCOPY)  Patient Location: Short Stay  Anesthesia Type:General  Level of Consciousness: awake  Airway & Oxygen Therapy: Patient Spontanous Breathing  Post-op Assessment: Report given to RN and Post -op Vital signs reviewed and stable  Post vital signs: Reviewed and stable  Last Vitals:  Vitals Value Taken Time  BP 137/62 08/04/24 10:50  Temp 36.5 C 08/04/24 10:50  Pulse 82 08/04/24 10:50  Resp 21 08/04/24 10:50  SpO2 100%     Last Pain:  Vitals:   08/04/24 1050  TempSrc: Oral         Complications: No notable events documented.
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
## 2024-08-04 NOTE — Anesthesia Postprocedure Evaluation (Signed)
 Anesthesia Post Note  Patient: Karen Craig  Procedure(s) Performed: COLONOSCOPY EGD (ESOPHAGOGASTRODUODENOSCOPY)  Patient location during evaluation: Phase II Anesthesia Type: General Level of consciousness: awake and alert Pain management: pain level controlled Vital Signs Assessment: post-procedure vital signs reviewed and stable Respiratory status: spontaneous breathing, nonlabored ventilation and respiratory function stable Cardiovascular status: stable Anesthetic complications: no   There were no known notable events for this encounter.   Last Vitals:  Vitals:   08/04/24 0948 08/04/24 1050  BP: (!) 144/81 137/62  Pulse:  82  Resp: 16 (!) 21  Temp: 36.7 C 36.5 C  SpO2: 98% 100%    Last Pain:  Vitals:   08/04/24 1050  TempSrc: Oral                 Katleen Carraway L Obryan Radu
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
## 2024-08-05 ENCOUNTER — Encounter (HOSPITAL_COMMUNITY): Payer: Self-pay | Admitting: Internal Medicine

## 2024-08-06 LAB — SURGICAL PATHOLOGY

## 2024-08-11 ENCOUNTER — Ambulatory Visit: Admitting: Nurse Practitioner

## 2024-08-25 ENCOUNTER — Ambulatory Visit: Admitting: Nurse Practitioner

## 2024-09-22 ENCOUNTER — Other Ambulatory Visit: Payer: Self-pay | Admitting: Orthopedic Surgery

## 2024-09-22 DIAGNOSIS — M25572 Pain in left ankle and joints of left foot: Secondary | ICD-10-CM

## 2024-10-20 ENCOUNTER — Ambulatory Visit: Admitting: Nurse Practitioner

## 2024-12-03 ENCOUNTER — Encounter: Payer: Self-pay | Admitting: Gastroenterology

## 2024-12-03 ENCOUNTER — Ambulatory Visit: Admitting: Gastroenterology

## 2024-12-09 ENCOUNTER — Other Ambulatory Visit: Payer: Self-pay | Admitting: Orthopedic Surgery

## 2024-12-09 DIAGNOSIS — M25572 Pain in left ankle and joints of left foot: Secondary | ICD-10-CM

## 2025-02-03 ENCOUNTER — Ambulatory Visit: Admitting: Nurse Practitioner
